# Patient Record
Sex: Female | Born: 1965 | Race: Black or African American | Hispanic: No | Marital: Married | Smoking: Never smoker
Health system: Southern US, Community
[De-identification: ages and names within clinical notes are randomized; demographics above are authoritative.]

## PROBLEM LIST (undated history)

## (undated) DIAGNOSIS — E669 Obesity, unspecified: Secondary | ICD-10-CM

## (undated) DIAGNOSIS — C8590 Non-Hodgkin lymphoma, unspecified, unspecified site: Secondary | ICD-10-CM

## (undated) HISTORY — PX: PORTACATH PLACEMENT: SHX2246

## (undated) HISTORY — DX: Obesity, unspecified: E66.9

## (undated) HISTORY — DX: Non-Hodgkin lymphoma, unspecified, unspecified site: C85.90

## (undated) HISTORY — PX: LAPAROSCOPIC GASTRIC SLEEVE RESECTION: SHX5895

---

## 2000-11-25 ENCOUNTER — Encounter: Admission: RE | Admit: 2000-11-25 | Discharge: 2000-11-25 | Payer: Self-pay

## 2002-02-02 ENCOUNTER — Encounter: Admission: RE | Admit: 2002-02-02 | Discharge: 2002-02-02 | Payer: Self-pay | Admitting: *Deleted

## 2002-02-02 ENCOUNTER — Encounter: Payer: Self-pay | Admitting: *Deleted

## 2002-05-17 ENCOUNTER — Other Ambulatory Visit: Admission: RE | Admit: 2002-05-17 | Discharge: 2002-05-17 | Payer: Self-pay | Admitting: Family Medicine

## 2005-02-03 ENCOUNTER — Encounter: Admission: RE | Admit: 2005-02-03 | Discharge: 2005-02-04 | Payer: Self-pay | Admitting: Family Medicine

## 2007-12-08 ENCOUNTER — Encounter: Admission: RE | Admit: 2007-12-08 | Discharge: 2007-12-08 | Payer: Self-pay | Admitting: Family Medicine

## 2010-04-18 ENCOUNTER — Inpatient Hospital Stay (HOSPITAL_COMMUNITY)
Admission: EM | Admit: 2010-04-18 | Discharge: 2010-04-21 | Payer: Self-pay | Source: Home / Self Care | Admitting: Emergency Medicine

## 2010-04-19 ENCOUNTER — Ambulatory Visit: Payer: Self-pay | Admitting: Surgery

## 2010-04-20 ENCOUNTER — Encounter (INDEPENDENT_AMBULATORY_CARE_PROVIDER_SITE_OTHER): Payer: Self-pay | Admitting: Internal Medicine

## 2010-04-22 ENCOUNTER — Ambulatory Visit: Payer: Self-pay | Admitting: Internal Medicine

## 2010-04-23 ENCOUNTER — Ambulatory Visit: Payer: Self-pay | Admitting: Surgery

## 2010-04-23 ENCOUNTER — Ambulatory Visit: Payer: Self-pay | Admitting: Internal Medicine

## 2010-04-28 ENCOUNTER — Encounter: Payer: Self-pay | Admitting: Internal Medicine

## 2010-04-28 ENCOUNTER — Ambulatory Visit (HOSPITAL_COMMUNITY): Admission: RE | Admit: 2010-04-28 | Discharge: 2010-04-28 | Payer: Self-pay | Admitting: Internal Medicine

## 2010-04-28 ENCOUNTER — Ambulatory Visit: Payer: Self-pay | Admitting: Cardiology

## 2010-04-28 ENCOUNTER — Ambulatory Visit: Payer: Self-pay

## 2010-04-29 ENCOUNTER — Ambulatory Visit (HOSPITAL_COMMUNITY): Admission: RE | Admit: 2010-04-29 | Discharge: 2010-04-29 | Payer: Self-pay | Admitting: Internal Medicine

## 2010-04-30 LAB — CBC WITH DIFFERENTIAL/PLATELET
BASO%: 0.4 % (ref 0.0–2.0)
Basophils Absolute: 0 10*3/uL (ref 0.0–0.1)
EOS%: 0.7 % (ref 0.0–7.0)
Eosinophils Absolute: 0 10*3/uL (ref 0.0–0.5)
HCT: 34 % — ABNORMAL LOW (ref 34.8–46.6)
HGB: 11.7 g/dL (ref 11.6–15.9)
LYMPH%: 27.7 % (ref 14.0–49.7)
MCH: 29.4 pg (ref 25.1–34.0)
MCHC: 34.5 g/dL (ref 31.5–36.0)
MCV: 85.1 fL (ref 79.5–101.0)
MONO#: 0.6 10*3/uL (ref 0.1–0.9)
MONO%: 10 % (ref 0.0–14.0)
NEUT#: 3.6 10*3/uL (ref 1.5–6.5)
NEUT%: 61.2 % (ref 38.4–76.8)
Platelets: 265 10*3/uL (ref 145–400)
RBC: 3.99 10*6/uL (ref 3.70–5.45)
RDW: 13.6 % (ref 11.2–14.5)
WBC: 6 10*3/uL (ref 3.9–10.3)
lymph#: 1.6 10*3/uL (ref 0.9–3.3)

## 2010-05-01 LAB — COMPREHENSIVE METABOLIC PANEL
ALT: 8 U/L (ref 0–35)
AST: 14 U/L (ref 0–37)
Albumin: 3.8 g/dL (ref 3.5–5.2)
Alkaline Phosphatase: 81 U/L (ref 39–117)
BUN: 7 mg/dL (ref 6–23)
CO2: 28 mEq/L (ref 19–32)
Calcium: 9.1 mg/dL (ref 8.4–10.5)
Chloride: 99 mEq/L (ref 96–112)
Creatinine, Ser: 0.84 mg/dL (ref 0.40–1.20)
Glucose, Bld: 86 mg/dL (ref 70–99)
Potassium: 4.2 mEq/L (ref 3.5–5.3)
Sodium: 136 mEq/L (ref 135–145)
Total Bilirubin: 0.5 mg/dL (ref 0.3–1.2)
Total Protein: 7.3 g/dL (ref 6.0–8.3)

## 2010-05-01 LAB — LACTATE DEHYDROGENASE: LDH: 294 U/L — ABNORMAL HIGH (ref 94–250)

## 2010-05-01 LAB — HEPATITIS PANEL, ACUTE
HCV Ab: NEGATIVE
Hep A IgM: NEGATIVE
Hep B C IgM: NEGATIVE
Hepatitis B Surface Ag: NEGATIVE

## 2010-05-01 LAB — URIC ACID: Uric Acid, Serum: 5.9 mg/dL (ref 2.4–7.0)

## 2010-05-06 ENCOUNTER — Ambulatory Visit (HOSPITAL_COMMUNITY): Admission: RE | Admit: 2010-05-06 | Discharge: 2010-05-06 | Payer: Self-pay | Admitting: Surgery

## 2010-05-06 LAB — COMPREHENSIVE METABOLIC PANEL
ALT: 11 U/L (ref 0–35)
AST: 21 U/L (ref 0–37)
Albumin: 3.3 g/dL — ABNORMAL LOW (ref 3.5–5.2)
Alkaline Phosphatase: 69 U/L (ref 39–117)
BUN: 6 mg/dL (ref 6–23)
CO2: 30 mEq/L (ref 19–32)
Calcium: 9.1 mg/dL (ref 8.4–10.5)
Chloride: 101 mEq/L (ref 96–112)
Creatinine, Ser: 0.87 mg/dL (ref 0.40–1.20)
Glucose, Bld: 95 mg/dL (ref 70–99)
Potassium: 3.8 mEq/L (ref 3.5–5.3)
Sodium: 139 mEq/L (ref 135–145)
Total Bilirubin: 0.2 mg/dL — ABNORMAL LOW (ref 0.3–1.2)
Total Protein: 7.2 g/dL (ref 6.0–8.3)

## 2010-05-06 LAB — CBC WITH DIFFERENTIAL/PLATELET
BASO%: 0.6 % (ref 0.0–2.0)
Basophils Absolute: 0 10*3/uL (ref 0.0–0.1)
EOS%: 1.4 % (ref 0.0–7.0)
Eosinophils Absolute: 0.1 10*3/uL (ref 0.0–0.5)
HCT: 33.2 % — ABNORMAL LOW (ref 34.8–46.6)
HGB: 11.2 g/dL — ABNORMAL LOW (ref 11.6–15.9)
LYMPH%: 32.5 % (ref 14.0–49.7)
MCH: 29 pg (ref 25.1–34.0)
MCHC: 33.7 g/dL (ref 31.5–36.0)
MCV: 86 fL (ref 79.5–101.0)
MONO#: 0.5 10*3/uL (ref 0.1–0.9)
MONO%: 8.3 % (ref 0.0–14.0)
NEUT#: 3.2 10*3/uL (ref 1.5–6.5)
NEUT%: 57.2 % (ref 38.4–76.8)
Platelets: 308 10*3/uL (ref 145–400)
RBC: 3.86 10*6/uL (ref 3.70–5.45)
RDW: 13.6 % (ref 11.2–14.5)
WBC: 5.6 10*3/uL (ref 3.9–10.3)
lymph#: 1.8 10*3/uL (ref 0.9–3.3)

## 2010-05-06 LAB — LACTATE DEHYDROGENASE: LDH: 228 U/L (ref 94–250)

## 2010-05-14 LAB — CBC WITH DIFFERENTIAL/PLATELET
BASO%: 0.5 % (ref 0.0–2.0)
Basophils Absolute: 0 10*3/uL (ref 0.0–0.1)
EOS%: 1 % (ref 0.0–7.0)
Eosinophils Absolute: 0 10*3/uL (ref 0.0–0.5)
HCT: 34.4 % — ABNORMAL LOW (ref 34.8–46.6)
HGB: 11.7 g/dL (ref 11.6–15.9)
LYMPH%: 35.4 % (ref 14.0–49.7)
MCH: 29 pg (ref 25.1–34.0)
MCHC: 34 g/dL (ref 31.5–36.0)
MCV: 85.2 fL (ref 79.5–101.0)
MONO#: 0.1 10*3/uL (ref 0.1–0.9)
MONO%: 2.2 % (ref 0.0–14.0)
NEUT#: 2.6 10*3/uL (ref 1.5–6.5)
NEUT%: 60.9 % (ref 38.4–76.8)
Platelets: 178 10*3/uL (ref 145–400)
RBC: 4.04 10*6/uL (ref 3.70–5.45)
RDW: 13.7 % (ref 11.2–14.5)
WBC: 4.3 10*3/uL (ref 3.9–10.3)
lymph#: 1.5 10*3/uL (ref 0.9–3.3)

## 2010-05-14 LAB — COMPREHENSIVE METABOLIC PANEL
ALT: 9 U/L (ref 0–35)
AST: 12 U/L (ref 0–37)
Albumin: 3.9 g/dL (ref 3.5–5.2)
Alkaline Phosphatase: 91 U/L (ref 39–117)
BUN: 11 mg/dL (ref 6–23)
CO2: 26 mEq/L (ref 19–32)
Calcium: 9.3 mg/dL (ref 8.4–10.5)
Chloride: 100 mEq/L (ref 96–112)
Creatinine, Ser: 0.66 mg/dL (ref 0.40–1.20)
Glucose, Bld: 84 mg/dL (ref 70–99)
Potassium: 4.3 mEq/L (ref 3.5–5.3)
Sodium: 141 mEq/L (ref 135–145)
Total Bilirubin: 0.5 mg/dL (ref 0.3–1.2)
Total Protein: 6.9 g/dL (ref 6.0–8.3)

## 2010-05-14 LAB — LACTATE DEHYDROGENASE: LDH: 196 U/L (ref 94–250)

## 2010-05-14 LAB — URIC ACID: Uric Acid, Serum: 2.4 mg/dL (ref 2.4–7.0)

## 2010-05-21 LAB — COMPREHENSIVE METABOLIC PANEL
ALT: 12 U/L (ref 0–35)
AST: 19 U/L (ref 0–37)
Albumin: 4.6 g/dL (ref 3.5–5.2)
Alkaline Phosphatase: 102 U/L (ref 39–117)
BUN: 9 mg/dL (ref 6–23)
CO2: 25 mEq/L (ref 19–32)
Calcium: 9.4 mg/dL (ref 8.4–10.5)
Chloride: 102 mEq/L (ref 96–112)
Creatinine, Ser: 0.69 mg/dL (ref 0.40–1.20)
Glucose, Bld: 100 mg/dL — ABNORMAL HIGH (ref 70–99)
Potassium: 4 mEq/L (ref 3.5–5.3)
Sodium: 138 mEq/L (ref 135–145)
Total Bilirubin: 0.2 mg/dL — ABNORMAL LOW (ref 0.3–1.2)
Total Protein: 7.8 g/dL (ref 6.0–8.3)

## 2010-05-21 LAB — CBC WITH DIFFERENTIAL/PLATELET
BASO%: 0.4 % (ref 0.0–2.0)
Basophils Absolute: 0 10*3/uL (ref 0.0–0.1)
EOS%: 0.5 % (ref 0.0–7.0)
Eosinophils Absolute: 0 10*3/uL (ref 0.0–0.5)
HCT: 36.4 % (ref 34.8–46.6)
HGB: 12.1 g/dL (ref 11.6–15.9)
LYMPH%: 34.2 % (ref 14.0–49.7)
MCH: 28.5 pg (ref 25.1–34.0)
MCHC: 33.2 g/dL (ref 31.5–36.0)
MCV: 85.9 fL (ref 79.5–101.0)
MONO#: 0.6 10*3/uL (ref 0.1–0.9)
MONO%: 10.1 % (ref 0.0–14.0)
NEUT#: 3.2 10*3/uL (ref 1.5–6.5)
NEUT%: 54.8 % (ref 38.4–76.8)
Platelets: 223 10*3/uL (ref 145–400)
RBC: 4.24 10*6/uL (ref 3.70–5.45)
RDW: 13.9 % (ref 11.2–14.5)
WBC: 5.9 10*3/uL (ref 3.9–10.3)
lymph#: 2 10*3/uL (ref 0.9–3.3)

## 2010-05-27 ENCOUNTER — Ambulatory Visit: Payer: Self-pay | Admitting: Internal Medicine

## 2010-05-29 LAB — CBC WITH DIFFERENTIAL/PLATELET
Basophils Absolute: 0 10*3/uL (ref 0.0–0.1)
Eosinophils Absolute: 0 10*3/uL (ref 0.0–0.5)
HGB: 11.7 g/dL (ref 11.6–15.9)
LYMPH%: 32.1 % (ref 14.0–49.7)
MCV: 85 fL (ref 79.5–101.0)
MONO%: 12.3 % (ref 0.0–14.0)
NEUT#: 2.1 10*3/uL (ref 1.5–6.5)
NEUT%: 54.3 % (ref 38.4–76.8)
Platelets: 332 10*3/uL (ref 145–400)

## 2010-05-29 LAB — HCG, SERUM, QUALITATIVE: Preg, Serum: NEGATIVE

## 2010-05-29 LAB — COMPREHENSIVE METABOLIC PANEL
Albumin: 4 g/dL (ref 3.5–5.2)
Alkaline Phosphatase: 92 U/L (ref 39–117)
BUN: 10 mg/dL (ref 6–23)
Glucose, Bld: 95 mg/dL (ref 70–99)
Total Bilirubin: 0.3 mg/dL (ref 0.3–1.2)

## 2010-06-04 LAB — COMPREHENSIVE METABOLIC PANEL
AST: 11 U/L (ref 0–37)
Alkaline Phosphatase: 107 U/L (ref 39–117)
Glucose, Bld: 108 mg/dL — ABNORMAL HIGH (ref 70–99)
Potassium: 3.5 mEq/L (ref 3.5–5.3)
Sodium: 138 mEq/L (ref 135–145)
Total Bilirubin: 0.4 mg/dL (ref 0.3–1.2)
Total Protein: 7 g/dL (ref 6.0–8.3)

## 2010-06-04 LAB — CBC WITH DIFFERENTIAL/PLATELET
BASO%: 0.4 % (ref 0.0–2.0)
EOS%: 0.5 % (ref 0.0–7.0)
LYMPH%: 30.9 % (ref 14.0–49.7)
MCH: 29 pg (ref 25.1–34.0)
MCHC: 34 g/dL (ref 31.5–36.0)
MCV: 85.3 fL (ref 79.5–101.0)
MONO%: 1.9 % (ref 0.0–14.0)
Platelets: 135 10*3/uL — ABNORMAL LOW (ref 145–400)
RBC: 3.97 10*6/uL (ref 3.70–5.45)
RDW: 15.7 % — ABNORMAL HIGH (ref 11.2–14.5)

## 2010-06-12 LAB — CBC WITH DIFFERENTIAL/PLATELET
Basophils Absolute: 0 10*3/uL (ref 0.0–0.1)
EOS%: 0.4 % (ref 0.0–7.0)
Eosinophils Absolute: 0 10*3/uL (ref 0.0–0.5)
LYMPH%: 33.5 % (ref 14.0–49.7)
MCH: 29.6 pg (ref 25.1–34.0)
MCV: 85.7 fL (ref 79.5–101.0)
MONO%: 13.1 % (ref 0.0–14.0)
NEUT#: 2.3 10*3/uL (ref 1.5–6.5)
Platelets: 271 10*3/uL (ref 145–400)
RBC: 3.97 10*6/uL (ref 3.70–5.45)

## 2010-06-12 LAB — COMPREHENSIVE METABOLIC PANEL
Alkaline Phosphatase: 95 U/L (ref 39–117)
BUN: 14 mg/dL (ref 6–23)
Glucose, Bld: 104 mg/dL — ABNORMAL HIGH (ref 70–99)
Sodium: 138 mEq/L (ref 135–145)
Total Bilirubin: 0.3 mg/dL (ref 0.3–1.2)

## 2010-06-19 LAB — COMPREHENSIVE METABOLIC PANEL
ALT: 20 U/L (ref 0–35)
AST: 23 U/L (ref 0–37)
Albumin: 4.1 g/dL (ref 3.5–5.2)
Alkaline Phosphatase: 75 U/L (ref 39–117)
BUN: 12 mg/dL (ref 6–23)
CO2: 24 mEq/L (ref 19–32)
Calcium: 9 mg/dL (ref 8.4–10.5)
Chloride: 104 mEq/L (ref 96–112)
Creatinine, Ser: 0.67 mg/dL (ref 0.40–1.20)
Glucose, Bld: 94 mg/dL (ref 70–99)
Potassium: 4.1 mEq/L (ref 3.5–5.3)
Sodium: 139 mEq/L (ref 135–145)
Total Bilirubin: 0.3 mg/dL (ref 0.3–1.2)
Total Protein: 7.1 g/dL (ref 6.0–8.3)

## 2010-06-19 LAB — CBC WITH DIFFERENTIAL/PLATELET
BASO%: 0.8 % (ref 0.0–2.0)
Basophils Absolute: 0 10*3/uL (ref 0.0–0.1)
EOS%: 0.2 % (ref 0.0–7.0)
Eosinophils Absolute: 0 10*3/uL (ref 0.0–0.5)
HCT: 35 % (ref 34.8–46.6)
HGB: 11.6 g/dL (ref 11.6–15.9)
LYMPH%: 26.4 % (ref 14.0–49.7)
MCH: 28.4 pg (ref 25.1–34.0)
MCHC: 33.1 g/dL (ref 31.5–36.0)
MCV: 85.8 fL (ref 79.5–101.0)
MONO#: 0.4 10*3/uL (ref 0.1–0.9)
MONO%: 8.2 % (ref 0.0–14.0)
NEUT#: 3.1 10*3/uL (ref 1.5–6.5)
NEUT%: 64.4 % (ref 38.4–76.8)
Platelets: 372 10*3/uL (ref 145–400)
RBC: 4.08 10*6/uL (ref 3.70–5.45)
RDW: 16.8 % — ABNORMAL HIGH (ref 11.2–14.5)
WBC: 4.8 10*3/uL (ref 3.9–10.3)
lymph#: 1.3 10*3/uL (ref 0.9–3.3)
nRBC: 0 % (ref 0–0)

## 2010-06-19 LAB — LACTATE DEHYDROGENASE: LDH: 167 U/L (ref 94–250)

## 2010-06-25 LAB — COMPREHENSIVE METABOLIC PANEL
ALT: 10 U/L (ref 0–35)
AST: 13 U/L (ref 0–37)
Albumin: 3.9 g/dL (ref 3.5–5.2)
Alkaline Phosphatase: 103 U/L (ref 39–117)
BUN: 9 mg/dL (ref 6–23)
CO2: 27 mEq/L (ref 19–32)
Calcium: 8.9 mg/dL (ref 8.4–10.5)
Chloride: 102 mEq/L (ref 96–112)
Creatinine, Ser: 0.57 mg/dL (ref 0.40–1.20)
Glucose, Bld: 93 mg/dL (ref 70–99)
Potassium: 3.6 mEq/L (ref 3.5–5.3)
Sodium: 138 mEq/L (ref 135–145)
Total Bilirubin: 0.4 mg/dL (ref 0.3–1.2)
Total Protein: 6.8 g/dL (ref 6.0–8.3)

## 2010-06-25 LAB — CBC WITH DIFFERENTIAL/PLATELET
BASO%: 0.4 % (ref 0.0–2.0)
Basophils Absolute: 0 10*3/uL (ref 0.0–0.1)
EOS%: 0.3 % (ref 0.0–7.0)
Eosinophils Absolute: 0 10*3/uL (ref 0.0–0.5)
HCT: 33.2 % — ABNORMAL LOW (ref 34.8–46.6)
HGB: 11 g/dL — ABNORMAL LOW (ref 11.6–15.9)
LYMPH%: 24.7 % (ref 14.0–49.7)
MCH: 28.4 pg (ref 25.1–34.0)
MCHC: 33.1 g/dL (ref 31.5–36.0)
MCV: 85.8 fL (ref 79.5–101.0)
MONO#: 0.6 10*3/uL (ref 0.1–0.9)
MONO%: 7.6 % (ref 0.0–14.0)
NEUT#: 5.3 10*3/uL (ref 1.5–6.5)
NEUT%: 67 % (ref 38.4–76.8)
Platelets: 122 10*3/uL — ABNORMAL LOW (ref 145–400)
RBC: 3.87 10*6/uL (ref 3.70–5.45)
RDW: 17.1 % — ABNORMAL HIGH (ref 11.2–14.5)
WBC: 7.9 10*3/uL (ref 3.9–10.3)
lymph#: 1.9 10*3/uL (ref 0.9–3.3)
nRBC: 0 % (ref 0–0)

## 2010-06-30 ENCOUNTER — Ambulatory Visit: Payer: Self-pay | Admitting: Internal Medicine

## 2010-07-02 LAB — CBC WITH DIFFERENTIAL/PLATELET
BASO%: 0.5 % (ref 0.0–2.0)
Basophils Absolute: 0 10*3/uL (ref 0.0–0.1)
EOS%: 0.2 % (ref 0.0–7.0)
Eosinophils Absolute: 0 10*3/uL (ref 0.0–0.5)
HCT: 34.2 % — ABNORMAL LOW (ref 34.8–46.6)
HGB: 11.4 g/dL — ABNORMAL LOW (ref 11.6–15.9)
LYMPH%: 23.6 % (ref 14.0–49.7)
MCH: 29.2 pg (ref 25.1–34.0)
MCHC: 33.4 g/dL (ref 31.5–36.0)
MCV: 87.4 fL (ref 79.5–101.0)
MONO#: 0.5 10*3/uL (ref 0.1–0.9)
MONO%: 8.6 % (ref 0.0–14.0)
NEUT#: 4.2 10*3/uL (ref 1.5–6.5)
NEUT%: 67.1 % (ref 38.4–76.8)
Platelets: 245 10*3/uL (ref 145–400)
RBC: 3.91 10*6/uL (ref 3.70–5.45)
RDW: 19.1 % — ABNORMAL HIGH (ref 11.2–14.5)
WBC: 6.3 10*3/uL (ref 3.9–10.3)
lymph#: 1.5 10*3/uL (ref 0.9–3.3)

## 2010-07-02 LAB — COMPREHENSIVE METABOLIC PANEL
ALT: 11 U/L (ref 0–35)
AST: 19 U/L (ref 0–37)
Albumin: 4.1 g/dL (ref 3.5–5.2)
Alkaline Phosphatase: 92 U/L (ref 39–117)
BUN: 10 mg/dL (ref 6–23)
CO2: 27 mEq/L (ref 19–32)
Calcium: 9.5 mg/dL (ref 8.4–10.5)
Chloride: 101 mEq/L (ref 96–112)
Creatinine, Ser: 0.75 mg/dL (ref 0.40–1.20)
Glucose, Bld: 106 mg/dL — ABNORMAL HIGH (ref 70–99)
Potassium: 3.5 mEq/L (ref 3.5–5.3)
Sodium: 138 mEq/L (ref 135–145)
Total Bilirubin: 0.2 mg/dL — ABNORMAL LOW (ref 0.3–1.2)
Total Protein: 6.8 g/dL (ref 6.0–8.3)

## 2010-07-06 ENCOUNTER — Ambulatory Visit (HOSPITAL_COMMUNITY)
Admission: RE | Admit: 2010-07-06 | Discharge: 2010-07-06 | Payer: Self-pay | Source: Home / Self Care | Attending: Internal Medicine | Admitting: Internal Medicine

## 2010-07-09 LAB — LACTATE DEHYDROGENASE: LDH: 161 U/L (ref 94–250)

## 2010-07-09 LAB — COMPREHENSIVE METABOLIC PANEL
ALT: 17 U/L (ref 0–35)
AST: 25 U/L (ref 0–37)
Albumin: 4 g/dL (ref 3.5–5.2)
Alkaline Phosphatase: 77 U/L (ref 39–117)
BUN: 9 mg/dL (ref 6–23)
CO2: 22 mEq/L (ref 19–32)
Calcium: 9.2 mg/dL (ref 8.4–10.5)
Chloride: 107 mEq/L (ref 96–112)
Creatinine, Ser: 0.75 mg/dL (ref 0.40–1.20)
Glucose, Bld: 114 mg/dL — ABNORMAL HIGH (ref 70–99)
Potassium: 3.8 mEq/L (ref 3.5–5.3)
Sodium: 141 mEq/L (ref 135–145)
Total Bilirubin: 0.3 mg/dL (ref 0.3–1.2)
Total Protein: 7.2 g/dL (ref 6.0–8.3)

## 2010-07-09 LAB — CBC WITH DIFFERENTIAL/PLATELET
BASO%: 0.5 % (ref 0.0–2.0)
Basophils Absolute: 0 10*3/uL (ref 0.0–0.1)
EOS%: 0.3 % (ref 0.0–7.0)
Eosinophils Absolute: 0 10*3/uL (ref 0.0–0.5)
HCT: 33.8 % — ABNORMAL LOW (ref 34.8–46.6)
HGB: 11.4 g/dL — ABNORMAL LOW (ref 11.6–15.9)
LYMPH%: 27.6 % (ref 14.0–49.7)
MCH: 29.6 pg (ref 25.1–34.0)
MCHC: 33.8 g/dL (ref 31.5–36.0)
MCV: 87.7 fL (ref 79.5–101.0)
MONO#: 0.5 10*3/uL (ref 0.1–0.9)
MONO%: 11.9 % (ref 0.0–14.0)
NEUT#: 2.3 10*3/uL (ref 1.5–6.5)
NEUT%: 59.7 % (ref 38.4–76.8)
Platelets: 389 10*3/uL (ref 145–400)
RBC: 3.85 10*6/uL (ref 3.70–5.45)
RDW: 19.3 % — ABNORMAL HIGH (ref 11.2–14.5)
WBC: 3.8 10*3/uL — ABNORMAL LOW (ref 3.9–10.3)
lymph#: 1 10*3/uL (ref 0.9–3.3)

## 2010-07-16 LAB — COMPREHENSIVE METABOLIC PANEL
ALT: 9 U/L (ref 0–35)
AST: 12 U/L (ref 0–37)
Albumin: 3.6 g/dL (ref 3.5–5.2)
Alkaline Phosphatase: 86 U/L (ref 39–117)
BUN: 11 mg/dL (ref 6–23)
CO2: 23 mEq/L (ref 19–32)
Calcium: 8.5 mg/dL (ref 8.4–10.5)
Chloride: 102 mEq/L (ref 96–112)
Creatinine, Ser: 0.67 mg/dL (ref 0.40–1.20)
Glucose, Bld: 86 mg/dL (ref 70–99)
Potassium: 3.4 mEq/L — ABNORMAL LOW (ref 3.5–5.3)
Sodium: 137 mEq/L (ref 135–145)
Total Bilirubin: 0.5 mg/dL (ref 0.3–1.2)
Total Protein: 6.4 g/dL (ref 6.0–8.3)

## 2010-07-16 LAB — CBC WITH DIFFERENTIAL/PLATELET
BASO%: 0.1 % (ref 0.0–2.0)
Basophils Absolute: 0 10*3/uL (ref 0.0–0.1)
EOS%: 0.3 % (ref 0.0–7.0)
Eosinophils Absolute: 0 10*3/uL (ref 0.0–0.5)
HCT: 32.7 % — ABNORMAL LOW (ref 34.8–46.6)
HGB: 11.1 g/dL — ABNORMAL LOW (ref 11.6–15.9)
LYMPH%: 21.7 % (ref 14.0–49.7)
MCH: 30 pg (ref 25.1–34.0)
MCHC: 33.9 g/dL (ref 31.5–36.0)
MCV: 88.6 fL (ref 79.5–101.0)
MONO#: 0.1 10*3/uL (ref 0.1–0.9)
MONO%: 1.4 % (ref 0.0–14.0)
NEUT#: 4.4 10*3/uL (ref 1.5–6.5)
NEUT%: 76.5 % (ref 38.4–76.8)
Platelets: 144 10*3/uL — ABNORMAL LOW (ref 145–400)
RBC: 3.69 10*6/uL — ABNORMAL LOW (ref 3.70–5.45)
RDW: 19.8 % — ABNORMAL HIGH (ref 11.2–14.5)
WBC: 5.7 10*3/uL (ref 3.9–10.3)
lymph#: 1.2 10*3/uL (ref 0.9–3.3)

## 2010-07-23 LAB — CBC WITH DIFFERENTIAL/PLATELET
BASO%: 2.1 % — ABNORMAL HIGH (ref 0.0–2.0)
Basophils Absolute: 0.1 10*3/uL (ref 0.0–0.1)
EOS%: 0.2 % (ref 0.0–7.0)
Eosinophils Absolute: 0 10*3/uL (ref 0.0–0.5)
HCT: 35.2 % (ref 34.8–46.6)
HGB: 12 g/dL (ref 11.6–15.9)
LYMPH%: 23.5 % (ref 14.0–49.7)
MCH: 29.8 pg (ref 25.1–34.0)
MCHC: 34 g/dL (ref 31.5–36.0)
MCV: 87.7 fL (ref 79.5–101.0)
MONO#: 0.6 10*3/uL (ref 0.1–0.9)
MONO%: 9 % (ref 0.0–14.0)
NEUT#: 4.2 10*3/uL (ref 1.5–6.5)
NEUT%: 65.2 % (ref 38.4–76.8)
Platelets: 239 10*3/uL (ref 145–400)
RBC: 4.02 10*6/uL (ref 3.70–5.45)
RDW: 19.5 % — ABNORMAL HIGH (ref 11.2–14.5)
WBC: 6.5 10*3/uL (ref 3.9–10.3)
lymph#: 1.5 10*3/uL (ref 0.9–3.3)

## 2010-07-23 LAB — COMPREHENSIVE METABOLIC PANEL
ALT: 17 U/L (ref 0–35)
AST: 23 U/L (ref 0–37)
Albumin: 4 g/dL (ref 3.5–5.2)
Alkaline Phosphatase: 88 U/L (ref 39–117)
BUN: 8 mg/dL (ref 6–23)
CO2: 27 mEq/L (ref 19–32)
Calcium: 9.6 mg/dL (ref 8.4–10.5)
Chloride: 103 mEq/L (ref 96–112)
Creatinine, Ser: 0.72 mg/dL (ref 0.40–1.20)
Glucose, Bld: 88 mg/dL (ref 70–99)
Potassium: 4.1 mEq/L (ref 3.5–5.3)
Sodium: 140 mEq/L (ref 135–145)
Total Bilirubin: 0.4 mg/dL (ref 0.3–1.2)
Total Protein: 7.7 g/dL (ref 6.0–8.3)

## 2010-07-30 LAB — CBC WITH DIFFERENTIAL/PLATELET
BASO%: 0.5 % (ref 0.0–2.0)
Basophils Absolute: 0 10*3/uL (ref 0.0–0.1)
EOS%: 0.1 % (ref 0.0–7.0)
Eosinophils Absolute: 0 10*3/uL (ref 0.0–0.5)
HCT: 35.1 % (ref 34.8–46.6)
HGB: 11.9 g/dL (ref 11.6–15.9)
LYMPH%: 22.3 % (ref 14.0–49.7)
MCH: 30.1 pg (ref 25.1–34.0)
MCHC: 34 g/dL (ref 31.5–36.0)
MCV: 88.5 fL (ref 79.5–101.0)
MONO#: 0.5 10*3/uL (ref 0.1–0.9)
MONO%: 11.3 % (ref 0.0–14.0)
NEUT#: 3.1 10*3/uL (ref 1.5–6.5)
NEUT%: 65.8 % (ref 38.4–76.8)
Platelets: 382 10*3/uL (ref 145–400)
RBC: 3.97 10*6/uL (ref 3.70–5.45)
RDW: 19.8 % — ABNORMAL HIGH (ref 11.2–14.5)
WBC: 4.7 10*3/uL (ref 3.9–10.3)
lymph#: 1.1 10*3/uL (ref 0.9–3.3)

## 2010-07-30 LAB — COMPREHENSIVE METABOLIC PANEL
ALT: 11 U/L (ref 0–35)
AST: 20 U/L (ref 0–37)
Albumin: 4.3 g/dL (ref 3.5–5.2)
Alkaline Phosphatase: 76 U/L (ref 39–117)
BUN: 12 mg/dL (ref 6–23)
CO2: 22 mEq/L (ref 19–32)
Calcium: 9.5 mg/dL (ref 8.4–10.5)
Chloride: 103 mEq/L (ref 96–112)
Creatinine, Ser: 0.71 mg/dL (ref 0.40–1.20)
Glucose, Bld: 93 mg/dL (ref 70–99)
Potassium: 3.9 mEq/L (ref 3.5–5.3)
Sodium: 139 mEq/L (ref 135–145)
Total Bilirubin: 0.3 mg/dL (ref 0.3–1.2)
Total Protein: 7.3 g/dL (ref 6.0–8.3)

## 2010-07-30 LAB — LACTATE DEHYDROGENASE: LDH: 149 U/L (ref 94–250)

## 2010-08-04 ENCOUNTER — Ambulatory Visit: Payer: Self-pay | Admitting: Internal Medicine

## 2010-08-06 LAB — CBC WITH DIFFERENTIAL/PLATELET
BASO%: 0.5 % (ref 0.0–2.0)
Basophils Absolute: 0 10*3/uL (ref 0.0–0.1)
EOS%: 0.3 % (ref 0.0–7.0)
Eosinophils Absolute: 0 10*3/uL (ref 0.0–0.5)
HCT: 35 % (ref 34.8–46.6)
HGB: 12 g/dL (ref 11.6–15.9)
LYMPH%: 22 % (ref 14.0–49.7)
MCH: 30.5 pg (ref 25.1–34.0)
MCHC: 34.4 g/dL (ref 31.5–36.0)
MCV: 88.5 fL (ref 79.5–101.0)
MONO#: 0.6 10*3/uL (ref 0.1–0.9)
MONO%: 12.4 % (ref 0.0–14.0)
NEUT#: 3.2 10*3/uL (ref 1.5–6.5)
NEUT%: 64.8 % (ref 38.4–76.8)
Platelets: 303 10*3/uL (ref 145–400)
RBC: 3.95 10*6/uL (ref 3.70–5.45)
RDW: 19.3 % — ABNORMAL HIGH (ref 11.2–14.5)
WBC: 4.9 10*3/uL (ref 3.9–10.3)
lymph#: 1.1 10*3/uL (ref 0.9–3.3)

## 2010-08-06 LAB — COMPREHENSIVE METABOLIC PANEL
ALT: 19 U/L (ref 0–35)
AST: 25 U/L (ref 0–37)
Albumin: 4.2 g/dL (ref 3.5–5.2)
Alkaline Phosphatase: 78 U/L (ref 39–117)
BUN: 14 mg/dL (ref 6–23)
CO2: 24 mEq/L (ref 19–32)
Calcium: 9.4 mg/dL (ref 8.4–10.5)
Chloride: 103 mEq/L (ref 96–112)
Creatinine, Ser: 0.79 mg/dL (ref 0.40–1.20)
Glucose, Bld: 80 mg/dL (ref 70–99)
Potassium: 4.2 mEq/L (ref 3.5–5.3)
Sodium: 138 mEq/L (ref 135–145)
Total Bilirubin: 0.3 mg/dL (ref 0.3–1.2)
Total Protein: 7.2 g/dL (ref 6.0–8.3)

## 2010-08-06 LAB — LACTATE DEHYDROGENASE: LDH: 161 U/L (ref 94–250)

## 2010-08-08 ENCOUNTER — Other Ambulatory Visit: Payer: Self-pay | Admitting: Internal Medicine

## 2010-08-08 DIAGNOSIS — C8590 Non-Hodgkin lymphoma, unspecified, unspecified site: Secondary | ICD-10-CM

## 2010-08-19 ENCOUNTER — Ambulatory Visit: Payer: 59 | Attending: Radiation Oncology | Admitting: Radiation Oncology

## 2010-08-19 DIAGNOSIS — Z510 Encounter for antineoplastic radiation therapy: Secondary | ICD-10-CM | POA: Insufficient documentation

## 2010-08-19 DIAGNOSIS — C8582 Other specified types of non-Hodgkin lymphoma, intrathoracic lymph nodes: Secondary | ICD-10-CM | POA: Insufficient documentation

## 2010-09-07 ENCOUNTER — Encounter (HOSPITAL_BASED_OUTPATIENT_CLINIC_OR_DEPARTMENT_OTHER): Payer: 59 | Admitting: Internal Medicine

## 2010-09-07 DIAGNOSIS — C8582 Other specified types of non-Hodgkin lymphoma, intrathoracic lymph nodes: Secondary | ICD-10-CM

## 2010-09-07 DIAGNOSIS — Z452 Encounter for adjustment and management of vascular access device: Secondary | ICD-10-CM

## 2010-09-28 LAB — GLUCOSE, CAPILLARY: Glucose-Capillary: 100 mg/dL — ABNORMAL HIGH (ref 70–99)

## 2010-09-30 LAB — CBC
HCT: 35.4 % — ABNORMAL LOW (ref 36.0–46.0)
MCHC: 33.1 g/dL (ref 30.0–36.0)
MCV: 84.5 fL (ref 78.0–100.0)
RDW: 13.1 % (ref 11.5–15.5)

## 2010-09-30 LAB — COMPREHENSIVE METABOLIC PANEL
Alkaline Phosphatase: 73 U/L (ref 39–117)
BUN: 5 mg/dL — ABNORMAL LOW (ref 6–23)
CO2: 29 mEq/L (ref 19–32)
Calcium: 9.2 mg/dL (ref 8.4–10.5)
GFR calc non Af Amer: >60 mL/min (ref >60)
Glucose, Bld: 79 mg/dL (ref 70–99)
Total Protein: 7.3 g/dL (ref 6.0–8.3)

## 2010-09-30 LAB — PROTIME-INR
INR: 1.02 (ref 0.00–1.49)
Prothrombin Time: 13.6 seconds (ref 11.6–15.2)

## 2010-09-30 LAB — SURGICAL PCR SCREEN
MRSA, PCR: NEGATIVE
Staphylococcus aureus: NEGATIVE

## 2010-10-01 ENCOUNTER — Encounter: Payer: 59 | Admitting: Internal Medicine

## 2010-10-01 LAB — HEPATIC FUNCTION PANEL
ALT: 12 U/L (ref 0–35)
AST: 22 U/L (ref 0–37)
Bilirubin, Direct: 0.1 mg/dL (ref 0.0–0.3)
Indirect Bilirubin: 0.3 mg/dL (ref 0.3–0.9)
Total Protein: 7.6 g/dL (ref 6.0–8.3)

## 2010-10-01 LAB — CBC
Hemoglobin: 11.1 g/dL — ABNORMAL LOW (ref 12.0–15.0)
Hemoglobin: 12.2 g/dL (ref 12.0–15.0)
MCH: 26.5 pg (ref 26.0–34.0)
MCH: 29.4 pg (ref 26.0–34.0)
MCH: 29.5 pg (ref 26.0–34.0)
MCH: 29.8 pg (ref 26.0–34.0)
MCHC: 30.8 g/dL (ref 30.0–36.0)
MCHC: 33.9 g/dL (ref 30.0–36.0)
MCHC: 34.6 g/dL (ref 30.0–36.0)
MCV: 86.1 fL (ref 78.0–100.0)
MCV: 86.1 fL (ref 78.0–100.0)
Platelets: 232 10*3/uL (ref 150–400)
Platelets: 311 10*3/uL (ref 150–400)
RBC: 3.77 MIL/uL — ABNORMAL LOW (ref 3.87–5.11)
RDW: 14.1 % (ref 11.5–15.5)
WBC: 7.5 10*3/uL (ref 4.0–10.5)

## 2010-10-01 LAB — DIFFERENTIAL
Basophils Relative: 0 % (ref 0–1)
Eosinophils Absolute: 0 10*3/uL (ref 0.0–0.7)
Eosinophils Absolute: 0.1 10*3/uL (ref 0.0–0.7)
Eosinophils Relative: 1 % (ref 0–5)
Lymphs Abs: 1.8 10*3/uL (ref 0.7–4.0)
Lymphs Abs: 2 10*3/uL (ref 0.7–4.0)
Monocytes Relative: 4 % (ref 3–12)
Monocytes Relative: 6 % (ref 3–12)
Neutro Abs: 5.1 10*3/uL (ref 1.7–7.7)
Neutrophils Relative %: 68 % (ref 43–77)
Neutrophils Relative %: 69 % (ref 43–77)

## 2010-10-01 LAB — TROPONIN I: Troponin I: 0.01 ng/mL (ref 0.00–0.06)

## 2010-10-01 LAB — T4: T4, Total: 8.9 ug/dL (ref 5.0–12.5)

## 2010-10-01 LAB — THYROGLOBULIN LEVEL: Thyroglobulin: 119 ng/mL — ABNORMAL HIGH (ref 0.0–55.0)

## 2010-10-01 LAB — GLUCOSE, CAPILLARY: Glucose-Capillary: 103 mg/dL — ABNORMAL HIGH (ref 70–99)

## 2010-10-01 LAB — BASIC METABOLIC PANEL
Calcium: 8.6 mg/dL (ref 8.4–10.5)
Creatinine, Ser: 0.67 mg/dL (ref 0.4–1.2)
GFR calc Af Amer: >60 mL/min (ref >60)
GFR calc non Af Amer: >60 mL/min (ref >60)
Sodium: 137 mEq/L (ref 135–145)

## 2010-10-01 LAB — PROTIME-INR
INR: 0.95 (ref 0.00–1.49)
Prothrombin Time: 12.9 seconds (ref 11.6–15.2)

## 2010-10-01 LAB — HEPARIN LEVEL (UNFRACTIONATED)
Heparin Unfractionated: 0.28 IU/mL — ABNORMAL LOW (ref 0.30–0.70)
Heparin Unfractionated: 0.3 IU/mL (ref 0.30–0.70)
Heparin Unfractionated: 0.45 IU/mL (ref 0.30–0.70)

## 2010-10-01 LAB — T3, FREE: T3, Free: 2.8 pg/mL (ref 2.3–4.2)

## 2010-10-01 LAB — TSH
TSH: 2.07 u[IU]/mL (ref 0.350–4.500)
TSH: 2.47 u[IU]/mL (ref 0.350–4.500)

## 2010-10-01 LAB — APTT: aPTT: 43 seconds — ABNORMAL HIGH (ref 24–37)

## 2010-10-01 LAB — CALCIUM: Calcium: 8.9 mg/dL (ref 8.4–10.5)

## 2010-10-01 LAB — CK TOTAL AND CKMB (NOT AT ARMC): Relative Index: INVALID (ref 0.0–2.5)

## 2010-10-01 LAB — PHOSPHORUS: Phosphorus: 3.7 mg/dL (ref 2.3–4.6)

## 2010-10-01 LAB — LACTATE DEHYDROGENASE: LDH: 226 U/L (ref 94–250)

## 2010-11-05 ENCOUNTER — Ambulatory Visit: Payer: 59 | Attending: Radiation Oncology | Admitting: Radiation Oncology

## 2010-11-10 DIAGNOSIS — R05 Cough: Secondary | ICD-10-CM

## 2010-11-12 ENCOUNTER — Encounter: Payer: Self-pay | Admitting: Pulmonary Disease

## 2010-11-13 ENCOUNTER — Ambulatory Visit (INDEPENDENT_AMBULATORY_CARE_PROVIDER_SITE_OTHER): Payer: 59 | Admitting: Pulmonary Disease

## 2010-11-13 ENCOUNTER — Other Ambulatory Visit (INDEPENDENT_AMBULATORY_CARE_PROVIDER_SITE_OTHER): Payer: 59

## 2010-11-13 ENCOUNTER — Ambulatory Visit (INDEPENDENT_AMBULATORY_CARE_PROVIDER_SITE_OTHER)
Admission: RE | Admit: 2010-11-13 | Discharge: 2010-11-13 | Disposition: A | Discharge status: Home / Self Care | Payer: 59 | Source: Ambulatory Visit | Attending: Pulmonary Disease | Admitting: Pulmonary Disease

## 2010-11-13 ENCOUNTER — Encounter: Payer: Self-pay | Admitting: Pulmonary Disease

## 2010-11-13 VITALS — BP 130/80 | HR 127 | Temp 98.4°F | Ht 63.75 in | Wt 235.0 lb

## 2010-11-13 DIAGNOSIS — R05 Cough: Secondary | ICD-10-CM

## 2010-11-13 DIAGNOSIS — R059 Cough, unspecified: Secondary | ICD-10-CM

## 2010-11-13 LAB — CBC WITH DIFFERENTIAL/PLATELET
Basophils Absolute: 0 10*3/uL (ref 0.0–0.1)
Eosinophils Absolute: 0.2 10*3/uL (ref 0.0–0.7)
HCT: 39.7 % (ref 36.0–46.0)
Hemoglobin: 13.6 g/dL (ref 12.0–15.0)
Lymphocytes Relative: 7.9 % — ABNORMAL LOW (ref 12.0–46.0)
Lymphs Abs: 0.6 10*3/uL — ABNORMAL LOW (ref 0.7–4.0)
MCHC: 34.1 g/dL (ref 30.0–36.0)
Monocytes Relative: 10.9 % (ref 3.0–12.0)
Neutro Abs: 6.2 10*3/uL (ref 1.4–7.7)
Platelets: 274 10*3/uL (ref 150.0–400.0)
RDW: 15.3 % — ABNORMAL HIGH (ref 11.5–14.6)

## 2010-11-13 LAB — COMPREHENSIVE METABOLIC PANEL
ALT: 17 U/L (ref 0–35)
AST: 21 U/L (ref 0–37)
CO2: 30 mEq/L (ref 19–32)
Creatinine, Ser: 0.8 mg/dL (ref 0.4–1.2)
GFR: 104.16 mL/min (ref >60.00)
Sodium: 138 mEq/L (ref 135–145)
Total Bilirubin: 0.5 mg/dL (ref 0.3–1.2)
Total Protein: 7.2 g/dL (ref 6.0–8.3)

## 2010-11-13 NOTE — Assessment & Plan Note (Signed)
This is very likely related to upper airway or from esophagitis/ GERD No evidence of radiation pneumonitis on CXR. As such, I do not think more prednisone is needed. Her cough is 80% improved & hopefully wil continue to improve I have asked her to stay on PPI for another month at least. She can take cough suppressant such as delsym & if worse, we will give her a codeine syrup. I do not think any other intervention is required at this time - her labs look ok. I believe Dr Shirline Frees has planned repeat imaging in a  few months

## 2010-11-13 NOTE — Patient Instructions (Addendum)
Chest xray appears clear OK to stop prednisone Blood work -CBC, CMET Delsym OTC upto 3 times  Daily If no effect, call us for codeine syrup

## 2010-11-13 NOTE — Progress Notes (Signed)
  Subjective:    Patient ID: Terri Luna, female    DOB: March 20, 1966, 45 y.o.   MRN: [id number]  HPI Ref : Dr Michell Heinrich Onc: Mohammed  45/F, never smoker for evaluation of cough post radiation therapy. 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 CMET ok   Review of Systems  Constitutional: Positive for fever and unexpected weight change.  HENT: Positive for congestion, rhinorrhea and postnasal drip. Negative for ear pain, nosebleeds, sore throat, sneezing, trouble swallowing, dental problem and sinus pressure.   Eyes: Negative for redness and itching.  Respiratory: Positive for cough, shortness of breath and wheezing. Negative for chest tightness.   Cardiovascular: Negative for palpitations and leg swelling.  Gastrointestinal: Negative for nausea and vomiting.  Genitourinary: Negative for dysuria.  Musculoskeletal: Negative for joint swelling.  Skin: Negative for rash.  Neurological: Positive for headaches.  Hematological: Bruises/bleeds easily.  Psychiatric/Behavioral: Negative for dysphoric mood. The patient is not nervous/anxious.        Objective:   Physical Exam Gen. Pleasant, well-nourished, in no distress, normal affect ENT - no lesions, no post nasal drip Neck: No JVD, no thyromegaly, no carotid bruits Lungs: no use of accessory muscles, no dullness to percussion, clear without rales or rhonchi  Cardiovascular: Rhythm regular, heart sounds   normal, no murmurs or gallops, no peripheral edema Abdomen: soft and non-tender, no hepatosplenomegaly, BS normal. Musculoskeletal: No deformities, no cyanosis or clubbing Neuro:  alert, non focal         Assessment & Plan:

## 2010-11-16 NOTE — Progress Notes (Signed)
Quick Note:  Pt informed of lab results. Pt verbalized understanding. ______

## 2010-11-27 ENCOUNTER — Institutional Professional Consult (permissible substitution): Payer: 59 | Admitting: Emergency Medicine

## 2010-12-01 NOTE — Assessment & Plan Note (Signed)
OFFICE VISIT   ROBENA, EWY  DOB:  November 10, 1965                                        April 23, 2010  CHART #:  [id number]   The patient returned to my office today for followup to discuss the  results of recent needle biopsy of an invasive mediastinal mass with  manubrial destruction.  The pathology shows a large cell B lymphoma.  I  discussed the results with her family.  I told them there is no role for  surgical resection of this and that she would be treated with  chemotherapy and radiation.  She has seen Dr. Arbutus Ped and Dr. Michell Heinrich  in [facility] today and we will plan to place a Port-A-Cath for  chemotherapy and treatment will start around May 04, 2010, by Dr.  Arbutus Ped.   Evelene Croon, M.D.  Electronically Signed   BB/MEDQ  D:  04/23/2010  T:  04/24/2010  Job:  [id number]

## 2010-12-07 ENCOUNTER — Institutional Professional Consult (permissible substitution): Payer: 59 | Admitting: Pulmonary Disease

## 2010-12-10 ENCOUNTER — Ambulatory Visit
Admission: RE | Admit: 2010-12-10 | Discharge: 2010-12-10 | Disposition: A | Discharge status: Home / Self Care | Payer: 59 | Source: Ambulatory Visit | Attending: Radiation Oncology | Admitting: Radiation Oncology

## 2011-01-28 ENCOUNTER — Encounter (HOSPITAL_COMMUNITY)
Admission: RE | Admit: 2011-01-28 | Discharge: 2011-01-28 | Disposition: A | Discharge status: Home / Self Care | Payer: 59 | Source: Ambulatory Visit | Attending: Internal Medicine | Admitting: Internal Medicine

## 2011-01-28 DIAGNOSIS — C8590 Non-Hodgkin lymphoma, unspecified, unspecified site: Secondary | ICD-10-CM

## 2011-01-28 DIAGNOSIS — I319 Disease of pericardium, unspecified: Secondary | ICD-10-CM | POA: Insufficient documentation

## 2011-01-28 DIAGNOSIS — Z923 Personal history of irradiation: Secondary | ICD-10-CM | POA: Insufficient documentation

## 2011-01-28 DIAGNOSIS — C8589 Other specified types of non-Hodgkin lymphoma, extranodal and solid organ sites: Secondary | ICD-10-CM | POA: Insufficient documentation

## 2011-01-28 DIAGNOSIS — Z9221 Personal history of antineoplastic chemotherapy: Secondary | ICD-10-CM | POA: Insufficient documentation

## 2011-01-28 MED ORDER — FLUDEOXYGLUCOSE F - 18 (FDG) INJECTION
17.6000 | Freq: Once | INTRAVENOUS | Status: AC | PRN
  Administered 2011-01-28: 17.6 via INTRAVENOUS

## 2011-02-03 ENCOUNTER — Other Ambulatory Visit: Payer: Self-pay | Admitting: Internal Medicine

## 2011-02-03 ENCOUNTER — Encounter (HOSPITAL_BASED_OUTPATIENT_CLINIC_OR_DEPARTMENT_OTHER): Payer: 59 | Admitting: Internal Medicine

## 2011-02-03 DIAGNOSIS — C8582 Other specified types of non-Hodgkin lymphoma, intrathoracic lymph nodes: Secondary | ICD-10-CM

## 2011-02-03 LAB — COMPREHENSIVE METABOLIC PANEL
Alkaline Phosphatase: 93 U/L (ref 39–117)
BUN: 14 mg/dL (ref 6–23)
Glucose, Bld: 87 mg/dL (ref 70–99)
Sodium: 138 mEq/L (ref 135–145)
Total Bilirubin: 0.4 mg/dL (ref 0.3–1.2)
Total Protein: 7.5 g/dL (ref 6.0–8.3)

## 2011-02-03 LAB — CBC WITH DIFFERENTIAL/PLATELET
Basophils Absolute: 0 10*3/uL (ref 0.0–0.1)
EOS%: 1.1 % (ref 0.0–7.0)
Eosinophils Absolute: 0 10*3/uL (ref 0.0–0.5)
HCT: 37.9 % (ref 34.8–46.6)
HGB: 12.9 g/dL (ref 11.6–15.9)
LYMPH%: 22.1 % (ref 14.0–49.7)
MCH: 29.8 pg (ref 25.1–34.0)
MCV: 87.5 fL (ref 79.5–101.0)
MONO%: 10 % (ref 0.0–14.0)
NEUT#: 2.8 10*3/uL (ref 1.5–6.5)
NEUT%: 66.4 % (ref 38.4–76.8)
Platelets: 259 10*3/uL (ref 145–400)

## 2011-02-10 ENCOUNTER — Encounter: Payer: 59 | Admitting: Internal Medicine

## 2011-05-05 ENCOUNTER — Other Ambulatory Visit: Payer: Self-pay | Admitting: Internal Medicine

## 2011-05-05 DIAGNOSIS — Z1231 Encounter for screening mammogram for malignant neoplasm of breast: Secondary | ICD-10-CM

## 2011-05-07 ENCOUNTER — Ambulatory Visit
Admission: RE | Admit: 2011-05-07 | Discharge: 2011-05-07 | Disposition: A | Discharge status: Home / Self Care | Payer: 59 | Source: Ambulatory Visit | Attending: Internal Medicine | Admitting: Internal Medicine

## 2011-05-07 DIAGNOSIS — Z1231 Encounter for screening mammogram for malignant neoplasm of breast: Secondary | ICD-10-CM

## 2011-05-29 ENCOUNTER — Encounter: Payer: Self-pay | Admitting: *Deleted

## 2011-06-01 ENCOUNTER — Other Ambulatory Visit: Payer: Self-pay | Admitting: Internal Medicine

## 2011-06-01 DIAGNOSIS — C8582 Other specified types of non-Hodgkin lymphoma, intrathoracic lymph nodes: Secondary | ICD-10-CM | POA: Insufficient documentation

## 2011-06-17 ENCOUNTER — Ambulatory Visit
Admission: RE | Admit: 2011-06-17 | Discharge: 2011-06-17 | Disposition: A | Discharge status: Home / Self Care | Payer: 59 | Source: Ambulatory Visit | Attending: Radiation Oncology | Admitting: Radiation Oncology

## 2011-06-17 DIAGNOSIS — C8582 Other specified types of non-Hodgkin lymphoma, intrathoracic lymph nodes: Secondary | ICD-10-CM

## 2011-06-17 NOTE — Progress Notes (Signed)
Completed radiation to mediastinum 10/01/10 for NHL. PET completed 01/28/11  Mammogram done 10/12 negative. No problems voiced except occasional pain over mediastinum.

## 2011-06-17 NOTE — Progress Notes (Signed)
CC:   Lajuana Matte, M.D. Oretha Milch, MD Hal Morales, M.D.  DIAGNOSIS:  Non-Hodgkin's lymphoma.  PREVIOUS RADIATION:  40 Gy to the mediastinum, completed 10/01/2010.  INTERVAL SINCE TREATMENT:  Nine months.  INTERVAL HISTORY:  Terri Luna reports for followup today.  Her breathing symptoms have completely resolved.  She has no cough, shortness of breath or drainage.  She has occasional chest pain after she does a lot of movement, which is associative the middle of her chest and which she feels is musculoskeletal in nature.  It does not happen all the time and is unpredictable.  She has been having some problems accessing her port recently, and they were not able to flush it the last time she was in. She is looking forward to having that removed after she sees Dr. Arbutus Ped in January.  She did have a PET scan in July that was negative for recurrent disease.  She had bilateral breast mammograms which were negative.  Her main concern is of weight gain, and she has been referred to [facility] by Dr. Pennie Rushing but really does not think this is a good fit for her.  She feels as though she is probably spending more time inside just because of all the weight she has gained.  PHYSICAL EXAMINATION:  Vital Signs:  Weight is 253 pounds which is up about 16 pounds from when we last saw.  Temperature 98.2, pulse 85, blood pressure 118/83.  Lymphatics:  She has no palpable cervical or supraclavicular adenopathy.  General:  She is alert and oriented x3.  IMPRESSION:  Stage II non-Hodgkin's lymphoma with resolving acute effects of treatment.  RECOMMENDATIONS:  Chip Boer looks great.  I am glad the respiratory symptoms have resolved.  We talked about her weight loss.  She is going to investigate the [facility] Program at the [facility] to see if that may help. I recommended she undergo annual MRI and mammogram.  I will talk to Dr. Pennie Rushing about these recommendations.  I have released her from  followup with me as she does have regularly scheduled followup with Dr. Arbutus Ped. We will be happy to see her back at any point in the future.    ______________________________ Lurline Hare, M.D. SW/MEDQ  D:  06/17/2011  T:  06/17/2011  Job:  [id number]

## 2011-07-02 ENCOUNTER — Telehealth: Payer: Self-pay | Admitting: Internal Medicine

## 2011-07-02 NOTE — Telephone Encounter (Signed)
Pt called and asked if she can put a "relaxer in her hair". She has been off chemo x 1 year. I told her I did not know of any reason she cannot put a relaxer in her hair.

## 2011-07-14 ENCOUNTER — Other Ambulatory Visit: Payer: Self-pay | Admitting: Internal Medicine

## 2011-07-14 ENCOUNTER — Ambulatory Visit: Payer: 59

## 2011-07-14 DIAGNOSIS — C8590 Non-Hodgkin lymphoma, unspecified, unspecified site: Secondary | ICD-10-CM

## 2011-07-14 MED ORDER — SODIUM CHLORIDE 0.9 % IJ SOLN
10.0000 mL | INTRAMUSCULAR | Status: AC | PRN
  Filled 2011-07-14: qty 10

## 2011-07-14 MED ORDER — HEPARIN SOD (PORK) LOCK FLUSH 100 UNIT/ML IV SOLN
500.0000 [IU] | Freq: Once | INTRAVENOUS | Status: AC | PRN
  Filled 2011-07-14: qty 5

## 2011-07-22 ENCOUNTER — Telehealth: Payer: Self-pay | Admitting: Internal Medicine

## 2011-07-22 NOTE — Telephone Encounter (Signed)
lmonvm adviising the pt of her pet scan appt with instructions along with the md appt.

## 2011-08-02 ENCOUNTER — Encounter (HOSPITAL_COMMUNITY)
Admission: RE | Admit: 2011-08-02 | Discharge: 2011-08-02 | Disposition: A | Discharge status: Home / Self Care | Payer: 59 | Source: Ambulatory Visit | Attending: Internal Medicine | Admitting: Internal Medicine

## 2011-08-02 DIAGNOSIS — C8590 Non-Hodgkin lymphoma, unspecified, unspecified site: Secondary | ICD-10-CM

## 2011-08-02 DIAGNOSIS — C8589 Other specified types of non-Hodgkin lymphoma, extranodal and solid organ sites: Secondary | ICD-10-CM | POA: Insufficient documentation

## 2011-08-02 MED ORDER — FLUDEOXYGLUCOSE F - 18 (FDG) INJECTION
15.0000 | Freq: Once | INTRAVENOUS | Status: AC | PRN
  Administered 2011-08-02: 15 via INTRAVENOUS

## 2011-08-05 ENCOUNTER — Ambulatory Visit (HOSPITAL_BASED_OUTPATIENT_CLINIC_OR_DEPARTMENT_OTHER): Payer: 59 | Admitting: Internal Medicine

## 2011-08-05 ENCOUNTER — Telehealth: Payer: Self-pay | Admitting: Internal Medicine

## 2011-08-05 VITALS — BP 111/78 | HR 83 | Temp 97.0°F | Ht 62.5 in | Wt 251.9 lb

## 2011-08-05 DIAGNOSIS — Z87898 Personal history of other specified conditions: Secondary | ICD-10-CM

## 2011-08-05 DIAGNOSIS — Z923 Personal history of irradiation: Secondary | ICD-10-CM

## 2011-08-05 DIAGNOSIS — Z9221 Personal history of antineoplastic chemotherapy: Secondary | ICD-10-CM

## 2011-08-05 DIAGNOSIS — C8582 Other specified types of non-Hodgkin lymphoma, intrathoracic lymph nodes: Secondary | ICD-10-CM

## 2011-08-05 NOTE — Telephone Encounter (Signed)
gv pt appt schedule for July including ct scan.

## 2011-08-08 NOTE — Progress Notes (Signed)
[facility] OFFICE PROGRESS NOTE  Altamese Arden-Arcade, MD, MD [address], [address] [place] [address]  PRINCIPAL DIAGNOSIS:  Stage II bulky large B-cell non-Hodgkin lymphoma diagnosed in October 2011.  PRIOR THERAPY:   1. Status post 4 cycles of systemic chemotherapy with CHOP/Rituxan, last dose was given July 10, 2010. 2. Status post curative radiotherapy to the mediastinum under the care of Dr. Michell Heinrich completed on October 01, 2010.  CURRENT THERAPY:  Observation.  INTERVAL HISTORY: Terri Luna 46 y.o. female returns to the clinic today for six-month followup visit. The patient has no complaints today she is feeling fine except for back pain from sitting long time at her desk at work. The patient denied having any significant weight loss or night sweats. She has no palpable lymphadenopathy. Chest pain or shortness of breath, no cough or hemoptysis. Has repeat PET scan performed recently and she is here today for evaluation and discussion of her scan results.  MEDICAL HISTORY: Past Medical History  Diagnosis Date  . Lymphoma   . Obesity     ALLERGIES:   has no known allergies.  MEDICATIONS:  Current Outpatient Prescriptions  Medication Sig Dispense Refill  . acetaminophen (TYLENOL) 500 MG tablet Take 1,000 mg by mouth every 6 (six) hours as needed.        . clindamycin (CLEOCIN) 150 MG capsule Take 150 mg by mouth 4 (four) times daily.       . VENTOLIN HFA 108 (90 BASE) MCG/ACT inhaler 2 puffs every 4 (four) hours as needed.        No current facility-administered medications for this visit.   Facility-Administered Medications Ordered in Other Visits  Medication Dose Route Frequency Provider Last Rate Last Dose  . sodium chloride 0.9 % injection 10 mL  10 mL Intravenous PRN Javonta Gronau K. Arbutus Ped, MD        REVIEW OF SYSTEMS:  A comprehensive review of systems was negative.   PHYSICAL EXAMINATION: General appearance: alert, cooperative and no  distress Lymph nodes: Cervical, supraclavicular, and axillary nodes normal. Resp: clear to auscultation bilaterally Cardio: regular rate and rhythm, S1, S2 normal, no murmur, click, rub or gallop GI: soft, non-tender; bowel sounds normal; no masses,  no organomegaly Extremities: extremities normal, atraumatic, no cyanosis or edema  ECOG PERFORMANCE STATUS: 0 - Asymptomatic  Blood pressure 111/78, pulse 83, temperature 97 F (36.1 C), temperature source Oral, height 5' 2.5" (1.588 m), weight 251 lb 14.4 oz (114.261 kg).  LABORATORY DATA: Lab Results  Component Value Date   WBC 4.2 02/03/2011   HGB 12.9 02/03/2011   HCT 37.9 02/03/2011   MCV 87.5 02/03/2011   PLT 259 02/03/2011      Chemistry      Component Value Date/Time   NA 138 02/03/2011 1308   NA 138 02/03/2011 1308   K 4.1 02/03/2011 1308   K 4.1 02/03/2011 1308   CL 101 02/03/2011 1308   CL 101 02/03/2011 1308   CO2 26 02/03/2011 1308   CO2 26 02/03/2011 1308   BUN 14 02/03/2011 1308   BUN 14 02/03/2011 1308   CREATININE 0.79 02/03/2011 1308   CREATININE 0.79 02/03/2011 1308      Component Value Date/Time   CALCIUM 9.6 02/03/2011 1308   CALCIUM 9.6 02/03/2011 1308   ALKPHOS 93 02/03/2011 1308   ALKPHOS 93 02/03/2011 1308   AST 20 02/03/2011 1308   AST 20 02/03/2011 1308   ALT 15 02/03/2011 1308   ALT 15  02/03/2011 1308   BILITOT 0.4 02/03/2011 1308   BILITOT 0.4 02/03/2011 1308       RADIOGRAPHIC STUDIES: Nm Pet Image Restag (ps) Skull Base To Thigh  08/02/2011  *RADIOLOGY REPORT*  Clinical Data: Subsequent treatment strategy for non Hodgkin's lymphoma.  NUCLEAR MEDICINE PET SKULL BASE TO THIGH  Fasting Blood Glucose:  98  Technique:  15.0 mCi F-18 FDG was injected intravenously.   CT data was obtained and used for attenuation correction and anatomic localization only.  (This was not acquired as a diagnostic CT examination.) Additional exam technical data entered  on technologist worksheet.  Comparison: PET CT 01/28/2011  Findings:   Neck: No hypermetabolic nodes in the neck.  Chest: No hypermetabolic mediastinal or hilar nodes.  No suspicious pulmonary nodules.  Abdomen / Pelvis:No abnormal hypermetabolic activity within the solid organs.  No evidence of abdominal or pelvic hypermetabolic nodes.  Skeleton:No focal hypermetabolic activity to suggest skeletal metastasis.  IMPRESSION: No evidence of lung cancer recurrence.  Original Report Authenticated By: Genevive Bi, M.D.    ASSESSMENT: This is a very pleasant 46 years old African American female with history of stage II bulky large B-cell non-Hodgkin lymphoma status post 4 cycles of systemic chemotherapy with CHOP/Rituxan followed by curative radiotherapy. The patient is doing fine and she has no evidence for disease recurrence. I discussed the PET scan results with the patient.  PLAN: I recommended for her continuous observation for now with repeat CT scan of the chest, abdomen and pelvis in 6 months. The patient is interested in taking the Port-A-Cath out. I Will refer her to Dr. Laneta Simmers for removal of her Port-A-Cath. She was advised to call me if she has any concerning symptoms in the interval.  All questions were answered. The patient knows to call the clinic with any problems, questions or concerns. We can certainly see the patient much sooner if necessary.

## 2011-08-09 ENCOUNTER — Telehealth: Payer: Self-pay | Admitting: Internal Medicine

## 2011-08-09 ENCOUNTER — Telehealth: Payer: Self-pay | Admitting: *Deleted

## 2011-08-09 ENCOUNTER — Other Ambulatory Visit: Payer: Self-pay | Admitting: Internal Medicine

## 2011-08-09 DIAGNOSIS — C8590 Non-Hodgkin lymphoma, unspecified, unspecified site: Secondary | ICD-10-CM

## 2011-08-09 NOTE — Telephone Encounter (Signed)
Left a message to have pt call me about appt time and place

## 2011-08-09 NOTE — Telephone Encounter (Signed)
Returned pt's call and per pt she called dr bartel's office re having her port removed and office says she needs order/referral from MM to remove port. Message sent to nurse. Pt aware she will be contacted when MM has given order.

## 2011-08-10 ENCOUNTER — Telehealth: Payer: Self-pay | Admitting: Internal Medicine

## 2011-08-10 ENCOUNTER — Telehealth: Payer: Self-pay | Admitting: *Deleted

## 2011-08-10 NOTE — Telephone Encounter (Signed)
Spoke with pt regarding appt for port removal on Friday.  Pt stated that was ok.  Called [facility] office to have scheduled.

## 2011-08-10 NOTE — Telephone Encounter (Signed)
Talked to [facility] @ [facility], Dr Laneta Simmers will remove port on a Friday, pt has been called and waiting for a call back.

## 2011-08-11 ENCOUNTER — Encounter (HOSPITAL_COMMUNITY): Payer: Self-pay | Admitting: *Deleted

## 2011-08-11 ENCOUNTER — Encounter (HOSPITAL_COMMUNITY): Payer: Self-pay | Admitting: Pharmacy Technician

## 2011-08-11 ENCOUNTER — Other Ambulatory Visit: Payer: Self-pay

## 2011-08-11 DIAGNOSIS — C8582 Other specified types of non-Hodgkin lymphoma, intrathoracic lymph nodes: Secondary | ICD-10-CM

## 2011-08-11 MED ORDER — DEXTROSE 5 % IV SOLN
1.5000 g | INTRAVENOUS | Status: AC
  Administered 2011-08-12: 1.5 g via INTRAVENOUS
  Filled 2011-08-11: qty 1.5

## 2011-08-12 ENCOUNTER — Ambulatory Visit (HOSPITAL_COMMUNITY)
Admission: RE | Admit: 2011-08-12 | Discharge: 2011-08-12 | Disposition: A | Discharge status: Home / Self Care | Payer: 59 | Source: Ambulatory Visit | Attending: Surgery | Admitting: Surgery

## 2011-08-12 ENCOUNTER — Encounter (HOSPITAL_COMMUNITY): Payer: Self-pay | Admitting: Anesthesiology

## 2011-08-12 ENCOUNTER — Ambulatory Visit (HOSPITAL_COMMUNITY): Payer: 59 | Admitting: Anesthesiology

## 2011-08-12 ENCOUNTER — Encounter (HOSPITAL_COMMUNITY): Payer: Self-pay | Admitting: *Deleted

## 2011-08-12 ENCOUNTER — Encounter (HOSPITAL_COMMUNITY)
Admission: RE | Disposition: A | Discharge status: Home / Self Care | Payer: Self-pay | Source: Ambulatory Visit | Attending: Surgery

## 2011-08-12 ENCOUNTER — Ambulatory Visit (HOSPITAL_COMMUNITY): Payer: 59

## 2011-08-12 DIAGNOSIS — C8582 Other specified types of non-Hodgkin lymphoma, intrathoracic lymph nodes: Secondary | ICD-10-CM

## 2011-08-12 DIAGNOSIS — C8589 Other specified types of non-Hodgkin lymphoma, extranodal and solid organ sites: Secondary | ICD-10-CM | POA: Insufficient documentation

## 2011-08-12 DIAGNOSIS — Z452 Encounter for adjustment and management of vascular access device: Secondary | ICD-10-CM | POA: Insufficient documentation

## 2011-08-12 DIAGNOSIS — Z9221 Personal history of antineoplastic chemotherapy: Secondary | ICD-10-CM | POA: Insufficient documentation

## 2011-08-12 DIAGNOSIS — E669 Obesity, unspecified: Secondary | ICD-10-CM | POA: Insufficient documentation

## 2011-08-12 HISTORY — PX: PORT-A-CATH REMOVAL: SHX5289

## 2011-08-12 LAB — BASIC METABOLIC PANEL
BUN: 11 mg/dL (ref 6–23)
Calcium: 9.6 mg/dL (ref 8.4–10.5)
Creatinine, Ser: 0.78 mg/dL (ref 0.50–1.10)
GFR calc Af Amer: >90 mL/min (ref >90)
GFR calc non Af Amer: >90 mL/min (ref >90)
Glucose, Bld: 95 mg/dL (ref 70–99)

## 2011-08-12 LAB — CBC
HCT: 39 % (ref 36.0–46.0)
Hemoglobin: 13.4 g/dL (ref 12.0–15.0)
MCH: 30 pg (ref 26.0–34.0)
MCHC: 34.4 g/dL (ref 30.0–36.0)
MCV: 87.4 fL (ref 78.0–100.0)
RDW: 13.5 % (ref 11.5–15.5)

## 2011-08-12 SURGERY — REMOVAL PORT-A-CATH
Anesthesia: General | Site: Chest | Laterality: Right | Wound class: Clean

## 2011-08-12 MED ORDER — MUPIROCIN 2 % EX OINT
TOPICAL_OINTMENT | Freq: Two times a day (BID) | CUTANEOUS | Status: DC
  Administered 2011-08-12: 1 via NASAL

## 2011-08-12 MED ORDER — FENTANYL CITRATE 0.05 MG/ML IJ SOLN
INTRAMUSCULAR | Status: DC | PRN
  Administered 2011-08-12 (×2): 50 ug via INTRAVENOUS
  Administered 2011-08-12: 150 ug via INTRAVENOUS

## 2011-08-12 MED ORDER — HYDROMORPHONE HCL PF 1 MG/ML IJ SOLN: 0.2500 mg | INTRAMUSCULAR | Status: DC | PRN

## 2011-08-12 MED ORDER — OXYCODONE-ACETAMINOPHEN 5-325 MG PO TABS: 1.0000 | ORAL_TABLET | ORAL | Status: AC | PRN

## 2011-08-12 MED ORDER — LIDOCAINE-EPINEPHRINE (PF) 1 %-1:200000 IJ SOLN
INTRAMUSCULAR | Status: DC | PRN
  Administered 2011-08-12: 6 mL

## 2011-08-12 MED ORDER — 0.9 % SODIUM CHLORIDE (POUR BTL) OPTIME
TOPICAL | Status: DC | PRN
  Administered 2011-08-12: 1000 mL

## 2011-08-12 MED ORDER — MUPIROCIN 2 % EX OINT
TOPICAL_OINTMENT | CUTANEOUS | Status: AC
  Administered 2011-08-12: 1 via NASAL
  Filled 2011-08-12: qty 22

## 2011-08-12 MED ORDER — PROPOFOL 10 MG/ML IV BOLUS
INTRAVENOUS | Status: DC | PRN
  Administered 2011-08-12 (×2): 10 mg via INTRAVENOUS

## 2011-08-12 MED ORDER — ONDANSETRON HCL 4 MG/2ML IJ SOLN: 4.0000 mg | Freq: Once | INTRAMUSCULAR | Status: DC | PRN

## 2011-08-12 MED ORDER — LACTATED RINGERS IV SOLN
INTRAVENOUS | Status: DC | PRN
  Administered 2011-08-12: 10:00:00 via INTRAVENOUS

## 2011-08-12 MED ORDER — MIDAZOLAM HCL 5 MG/5ML IJ SOLN
INTRAMUSCULAR | Status: DC | PRN
  Administered 2011-08-12: 2 mg via INTRAVENOUS

## 2011-08-12 MED ORDER — LACTATED RINGERS IV SOLN: INTRAVENOUS | Status: DC

## 2011-08-12 SURGICAL SUPPLY — 33 items
BAG DECANTER FOR FLEXI CONT (MISCELLANEOUS) IMPLANT
BLADE SURG 11 STRL SS (BLADE) ×2 IMPLANT
CANISTER SUCTION 2500CC (MISCELLANEOUS) ×2 IMPLANT
CLOTH BEACON ORANGE TIMEOUT ST (SAFETY) ×2 IMPLANT
COVER SURGICAL LIGHT HANDLE (MISCELLANEOUS) ×4 IMPLANT
DERMABOND ADHESIVE PROPEN (GAUZE/BANDAGES/DRESSINGS) ×1
DERMABOND ADVANCED .7 DNX6 (GAUZE/BANDAGES/DRESSINGS) ×1 IMPLANT
DRAPE INCISE IOBAN 66X45 STRL (DRAPES) ×2 IMPLANT
DRAPE LAPAROTOMY T 102X78X121 (DRAPES) ×2 IMPLANT
ELECT CAUTERY BLADE 6.4 (BLADE) ×2 IMPLANT
ELECT REM PT RETURN 9FT ADLT (ELECTROSURGICAL) ×2
ELECTRODE REM PT RTRN 9FT ADLT (ELECTROSURGICAL) ×1 IMPLANT
GLOVE BIO SURGEON STRL SZ7 (GLOVE) ×2 IMPLANT
GLOVE BIOGEL PI IND STRL 7.0 (GLOVE) ×1 IMPLANT
GLOVE BIOGEL PI INDICATOR 7.0 (GLOVE) ×1
GLOVE EUDERMIC 7 POWDERFREE (GLOVE) ×2 IMPLANT
GOWN PREVENTION PLUS XLARGE (GOWN DISPOSABLE) ×2 IMPLANT
GOWN STRL NON-REIN LRG LVL3 (GOWN DISPOSABLE) ×4 IMPLANT
KIT BASIN OR (CUSTOM PROCEDURE TRAY) ×2 IMPLANT
KIT ROOM TURNOVER OR (KITS) ×2 IMPLANT
NEEDLE 22X1 1/2 (OR ONLY) (NEEDLE) ×2 IMPLANT
NS IRRIG 1000ML POUR BTL (IV SOLUTION) ×2 IMPLANT
PACK GENERAL/GYN (CUSTOM PROCEDURE TRAY) ×2 IMPLANT
PAD ARMBOARD 7.5X6 YLW CONV (MISCELLANEOUS) ×4 IMPLANT
SPONGE GAUZE 4X4 12PLY (GAUZE/BANDAGES/DRESSINGS) ×2 IMPLANT
SUT SILK 2 0 SH (SUTURE) IMPLANT
SUT VIC AB 3-0 SH 27 (SUTURE) ×1
SUT VIC AB 3-0 SH 27X BRD (SUTURE) ×1 IMPLANT
SYR 20CC LL (SYRINGE) IMPLANT
SYR CONTROL 10ML LL (SYRINGE) ×2 IMPLANT
TOWEL OR 17X24 6PK STRL BLUE (TOWEL DISPOSABLE) ×2 IMPLANT
TOWEL OR 17X26 10 PK STRL BLUE (TOWEL DISPOSABLE) ×2 IMPLANT
WATER STERILE IRR 1000ML POUR (IV SOLUTION) ×2 IMPLANT

## 2011-08-12 NOTE — H&P (Signed)
Terri Luna is an 46 y.o. female.   Chief Complaint: Shelda Pal cath that is not needed anymore HPI: 46 yo woman who had lymphoma treated with chemotherapy via portacath. She has completed treatment and oncology does not feel that she requires portacath anymore.  Past Medical History  Diagnosis Date  . Lymphoma   . Obesity     Past Surgical History  Procedure Date  . Portacath placement     Family History  Problem Relation Age of Onset  . Hypertension Mother   . Diabetes Maternal Grandmother   . Lung cancer Father   . Anemia Mother    Social History:  reports that she has never smoked. She has never used smokeless tobacco. She reports that she drinks alcohol. She reports that she does not use illicit drugs.  Allergies: No Known Allergies  Medications Prior to Admission  Medication Dose Route Frequency Provider Last Rate Last Dose  . cefUROXime (ZINACEF) 1.5 g in dextrose 5 % 50 mL IVPB  1.5 g Intravenous To OR Alleen Borne, MD      . lactated ringers infusion   Intravenous Continuous Melonie Florida, MD      . mupirocin ointment (BACTROBAN) 2 %   Nasal BID Alleen Borne, MD   1 application at 08/12/11 0759  . sodium chloride 0.9 % injection 10 mL  10 mL Intravenous PRN Mohamed K. Arbutus Ped, MD       No current outpatient prescriptions on file as of 08/12/2011.    Results for orders placed during the hospital encounter of 08/12/11 (from the past 48 hour(s))  CBC     Status: Normal   Collection Time   08/12/11  7:53 AM      Component Value Range Comment   WBC 6.0  4.0 - 10.5 (K/uL)    RBC 4.46  3.87 - 5.11 (MIL/uL)    Hemoglobin 13.4  12.0 - 15.0 (g/dL)    HCT 16.1  09.6 - 04.5 (%)    MCV 87.4  78.0 - 100.0 (fL)    MCH 30.0  26.0 - 34.0 (pg)    MCHC 34.4  30.0 - 36.0 (g/dL)    RDW 40.9  81.1 - 91.4 (%)    Platelets 235  150 - 400 (K/uL)   BASIC METABOLIC PANEL     Status: Normal   Collection Time   08/12/11  7:53 AM      Component Value Range Comment   Sodium 138   135 - 145 (mEq/L)    Potassium 3.9  3.5 - 5.1 (mEq/L)    Chloride 104  96 - 112 (mEq/L)    CO2 25  19 - 32 (mEq/L)    Glucose, Bld 95  70 - 99 (mg/dL)    BUN 11  6 - 23 (mg/dL)    Creatinine, Ser 7.82  0.50 - 1.10 (mg/dL)    Calcium 9.6  8.4 - 10.5 (mg/dL)    GFR calc non Af Amer >90  >90 (mL/min)    GFR calc Af Amer >90  >90 (mL/min)   SURGICAL PCR SCREEN     Status: Normal   Collection Time   08/12/11  8:08 AM      Component Value Range Comment   MRSA, PCR NEGATIVE  NEGATIVE     Staphylococcus aureus NEGATIVE  NEGATIVE     Dg Chest 2 View Within Previous 72 Hours.  Films Obtained On Friday Are Acceptable For Monday And Tuesday Cases  08/12/2011  *  RADIOLOGY REPORT*  Clinical Data: Hodgkin lymphoma.  Remove right Port-A-Cath.  CHEST - 2 VIEW  Comparison: 11/13/2010.  Findings: Trachea is midline.  Heart size normal.  Right subclavian Port-A-Cath tip projects over the SVC.  Lungs are low in volume but clear.  No pleural fluid.  IMPRESSION: No acute findings.  Original Report Authenticated By: Reyes Ivan, M.D.    ROS  Negative  Blood pressure 107/78, pulse 92, temperature 98 F (36.7 C), temperature source Oral, resp. rate 16, height 5\' 3"  (1.6 m), SpO2 98.00%. Physical Exam  Cardiovascular: Normal rate, regular rhythm and normal heart sounds.   Respiratory: Breath sounds normal.   Portacath in subcutaneous pocket right anterior chest wall  Assessment/Plan Portacath no longer needed.  Will remove. Procedure, alternative, benefits, and risks discussed with patient and she agrees to procede.  BARTLE,BRYAN K 08/12/2011, 10:58 AM

## 2011-08-12 NOTE — Anesthesia Preprocedure Evaluation (Addendum)
Anesthesia Evaluation  Patient identified by MRN, date of birth, ID band Patient awake and Patient confused    Reviewed: Allergy & Precautions, H&P , NPO status , Patient's Chart, lab work & pertinent test results, reviewed documented beta blocker date and time   Airway Mallampati: II      Dental  (+) Teeth Intact   Pulmonary  clear to auscultation        Cardiovascular Regular Normal    Neuro/Psych    GI/Hepatic   Endo/Other    Renal/GU      Musculoskeletal   Abdominal   Peds  Hematology   Anesthesia Other Findings   Reproductive/Obstetrics                         Anesthesia Physical Anesthesia Plan  ASA: II  Anesthesia Plan: General   Post-op Pain Management:    Induction: Intravenous  Airway Management Planned: Nasal Cannula  Additional Equipment:   Intra-op Plan:   Post-operative Plan:   Informed Consent: I have reviewed the patients History and Physical, chart, labs and discussed the procedure including the risks, benefits and alternatives for the proposed anesthesia with the patient or authorized representative who has indicated his/her understanding and acceptance.     Plan Discussed with: CRNA and Surgeon  Anesthesia Plan Comments: (Plan MAC  Kipp Brood, MD)        Anesthesia Quick Evaluation

## 2011-08-12 NOTE — Brief Op Note (Signed)
08/12/2011  11:45 AM  PATIENT:  Terri Luna  46 y.o. female  PRE-OPERATIVE DIAGNOSIS:  Non-Hodgkin's lymphoma  POST-OPERATIVE DIAGNOSIS:  Non-Hodgkin's lymphoma  PROCEDURE:  Procedure(s): REMOVAL PORT-A-CATH  SURGEON:  Surgeon(s): Alleen Borne, MD  PHYSICIAN ASSISTANT: none   ANESTHESIA:   local and IV sedation  EBL:   none  BLOOD ADMINISTERED:none  To PACU, stable

## 2011-08-12 NOTE — Preoperative (Signed)
Beta Blockers   Reason not to administer Beta Blockers:Not Applicable 

## 2011-08-12 NOTE — Transfer of Care (Signed)
Immediate Anesthesia Transfer of Care Note  Patient: Terri Luna  Procedure(s) Performed:  REMOVAL PORT-A-CATH  Patient Location: PACU  Anesthesia Type: MAC  Level of Consciousness: awake, alert  and oriented  Airway & Oxygen Therapy: Patient Spontanous Breathing  Post-op Assessment: Report given to PACU RN  Post vital signs: Reviewed and stable  Complications: No apparent anesthesia complications

## 2011-08-12 NOTE — Anesthesia Postprocedure Evaluation (Signed)
  Anesthesia Post-op Note  Patient: Terri Luna  Procedure(s) Performed:  REMOVAL PORT-A-CATH  Patient Location: PACU  Anesthesia Type: MAC  Level of Consciousness: awake and alert   Airway and Oxygen Therapy: Patient Spontanous Breathing  Post-op Pain: none  Post-op Assessment: Post-op Vital signs reviewed  Post-op Vital Signs: stable  Complications: No apparent anesthesia complications

## 2011-08-12 NOTE — Interval H&P Note (Signed)
History and Physical Interval Note:  08/12/2011 11:02 AM  Terri Luna  has presented today for surgery, with the diagnosis of Non-Hodgkin's lymphoma  The various methods of treatment have been discussed with the patient and family. After consideration of risks, benefits and other options for treatment, the patient has consented to  Procedure(s): REMOVAL PORT-A-CATH as a surgical intervention .  The patients' history has been reviewed, patient examined, no change in status, stable for surgery.  I have reviewed the patients' chart and labs.  Questions were answered to the patient's satisfaction.     Alleen Borne

## 2011-08-13 ENCOUNTER — Encounter: Payer: Self-pay | Admitting: *Deleted

## 2011-08-16 NOTE — Telephone Encounter (Signed)
Erroneous message

## 2011-08-24 ENCOUNTER — Ambulatory Visit (INDEPENDENT_AMBULATORY_CARE_PROVIDER_SITE_OTHER): Payer: 59 | Admitting: *Deleted

## 2011-08-24 ENCOUNTER — Ambulatory Visit: Payer: 59 | Admitting: Surgery

## 2011-08-24 DIAGNOSIS — C3490 Malignant neoplasm of unspecified part of unspecified bronchus or lung: Secondary | ICD-10-CM

## 2011-08-24 DIAGNOSIS — Z8571 Personal history of Hodgkin lymphoma: Secondary | ICD-10-CM

## 2011-08-24 DIAGNOSIS — Z09 Encounter for follow-up examination after completed treatment for conditions other than malignant neoplasm: Secondary | ICD-10-CM

## 2011-08-24 NOTE — Progress Notes (Unsigned)
Mrs. Terri Luna had removal of her RIGHT PORT-A-CATH on 08/12/11 by Dr. Laneta Simmers.  She returns for inspection of the site.  It is completely healed without s/s of infection, only residual Dermabond remains.  She will rtc prn.

## 2011-08-25 ENCOUNTER — Ambulatory Visit: Payer: 59

## 2011-08-25 MED ORDER — HEPARIN SOD (PORK) LOCK FLUSH 100 UNIT/ML IV SOLN
500.0000 [IU] | Freq: Once | INTRAVENOUS | Status: AC | PRN
  Filled 2011-08-25: qty 5

## 2011-08-25 MED ORDER — SODIUM CHLORIDE 0.9 % IJ SOLN
10.0000 mL | INTRAMUSCULAR | Status: AC | PRN
  Filled 2011-08-25: qty 10

## 2012-02-02 ENCOUNTER — Ambulatory Visit (HOSPITAL_COMMUNITY)
Admission: RE | Admit: 2012-02-02 | Discharge: 2012-02-02 | Disposition: A | Discharge status: Home / Self Care | Payer: 59 | Source: Ambulatory Visit | Attending: Internal Medicine | Admitting: Internal Medicine

## 2012-02-02 ENCOUNTER — Other Ambulatory Visit (HOSPITAL_BASED_OUTPATIENT_CLINIC_OR_DEPARTMENT_OTHER): Payer: 59 | Admitting: Lab

## 2012-02-02 DIAGNOSIS — C8582 Other specified types of non-Hodgkin lymphoma, intrathoracic lymph nodes: Secondary | ICD-10-CM

## 2012-02-02 DIAGNOSIS — Z923 Personal history of irradiation: Secondary | ICD-10-CM | POA: Insufficient documentation

## 2012-02-02 DIAGNOSIS — K5730 Diverticulosis of large intestine without perforation or abscess without bleeding: Secondary | ICD-10-CM | POA: Insufficient documentation

## 2012-02-02 DIAGNOSIS — Z9221 Personal history of antineoplastic chemotherapy: Secondary | ICD-10-CM | POA: Insufficient documentation

## 2012-02-02 DIAGNOSIS — K449 Diaphragmatic hernia without obstruction or gangrene: Secondary | ICD-10-CM | POA: Insufficient documentation

## 2012-02-02 DIAGNOSIS — C8589 Other specified types of non-Hodgkin lymphoma, extranodal and solid organ sites: Secondary | ICD-10-CM | POA: Insufficient documentation

## 2012-02-02 LAB — CMP (CANCER CENTER ONLY)
ALT(SGPT): 25 U/L (ref 10–47)
AST: 28 U/L (ref 11–38)
Albumin: 3.6 g/dL (ref 3.3–5.5)
Alkaline Phosphatase: 96 U/L — ABNORMAL HIGH (ref 26–84)
Potassium: 4.4 mEq/L (ref 3.3–4.7)
Sodium: 140 mEq/L (ref 128–145)
Total Bilirubin: 0.6 mg/dl (ref 0.20–1.60)
Total Protein: 7.5 g/dL (ref 6.4–8.1)

## 2012-02-02 LAB — CBC WITH DIFFERENTIAL/PLATELET
BASO%: 0.6 % (ref 0.0–2.0)
EOS%: 2.6 % (ref 0.0–7.0)
Eosinophils Absolute: 0.1 10*3/uL (ref 0.0–0.5)
LYMPH%: 24.5 % (ref 14.0–49.7)
MCHC: 34.1 g/dL (ref 31.5–36.0)
MCV: 90.1 fL (ref 79.5–101.0)
MONO%: 7 % (ref 0.0–14.0)
NEUT#: 3.3 10*3/uL (ref 1.5–6.5)
Platelets: 251 10*3/uL (ref 145–400)
RBC: 4.36 10*6/uL (ref 3.70–5.45)
RDW: 14.4 % (ref 11.2–14.5)
WBC: 5.1 10*3/uL (ref 3.9–10.3)

## 2012-02-02 MED ORDER — IOHEXOL 300 MG/ML  SOLN
100.0000 mL | Freq: Once | INTRAMUSCULAR | Status: AC | PRN
  Administered 2012-02-02: 100 mL via INTRAVENOUS

## 2012-02-07 ENCOUNTER — Ambulatory Visit (HOSPITAL_BASED_OUTPATIENT_CLINIC_OR_DEPARTMENT_OTHER): Payer: 59 | Admitting: Internal Medicine

## 2012-02-07 ENCOUNTER — Telehealth: Payer: Self-pay | Admitting: *Deleted

## 2012-02-07 ENCOUNTER — Ambulatory Visit: Payer: 59 | Admitting: Internal Medicine

## 2012-02-07 VITALS — BP 126/86 | HR 101 | Temp 97.6°F | Ht 63.0 in | Wt 256.7 lb

## 2012-02-07 DIAGNOSIS — C8582 Other specified types of non-Hodgkin lymphoma, intrathoracic lymph nodes: Secondary | ICD-10-CM

## 2012-02-07 NOTE — Progress Notes (Signed)
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   PRIOR THERAPY:  1. Status post 4 cycles of systemic chemotherapy with CHOP/Rituxan, last dose was given July 10, 2010. 2. Status post curative radiotherapy to the mediastinum under the care of Dr. Michell Heinrich completed on October 01, 2010.  CURRENT THERAPY: Observation.  INTERVAL HISTORY: Terri Luna 46 y.o. female returns to the clinic today for six-month followup visit. The patient is feeling fine today with no specific complaints. She denied having any significant weight loss or night sweats. She has no chest pain or shortness of breath, no cough or hemoptysis. The patient denied having any palpable lymphadenopathy. She has repeat CT scan of the chest, abdomen and pelvis performed recently and she is here today for evaluation and discussion of her scan results.  MEDICAL HISTORY: Past Medical History  Diagnosis Date  . Lymphoma   . Obesity     ALLERGIES:   has no known allergies.  MEDICATIONS:  No current outpatient prescriptions on file.   No current facility-administered medications for this visit.   Facility-Administered Medications Ordered in Other Visits  Medication Dose Route Frequency Provider Last Rate Last Dose  . sodium chloride 0.9 % injection 10 mL  10 mL Intravenous PRN Si Gaul, MD      . sodium chloride 0.9 % injection 10 mL  10 mL Intravenous PRN Si Gaul, MD        SURGICAL HISTORY:  Past Surgical History  Procedure Date  . Portacath placement   . Port-a-cath removal 08/12/2011    Procedure: REMOVAL PORT-A-CATH;  Surgeon: Alleen Borne, MD;  Location: [facility] OR;  Service: Thoracic;  Laterality: Right;    REVIEW OF SYSTEMS:  A comprehensive review of systems was  negative.   PHYSICAL EXAMINATION: General appearance: alert, cooperative and no distress Head: Normocephalic, without obvious abnormality, atraumatic Neck: no adenopathy Lymph nodes: Cervical, supraclavicular, and axillary nodes normal. Resp: clear to auscultation bilaterally Cardio: regular rate and rhythm, S1, S2 normal, no murmur, click, rub or gallop GI: soft, non-tender; bowel sounds normal; no masses,  no organomegaly Extremities: extremities normal, atraumatic, no cyanosis or edema Neurologic: Alert and oriented X 3, normal strength and tone. Normal symmetric reflexes. Normal coordination and gait  ECOG PERFORMANCE STATUS: 0 - Asymptomatic  Blood pressure 126/86, pulse 101, temperature 97.6 F (36.4 C), temperature source Oral, height 5\' 3"  (1.6 m), weight 256 lb 11.2 oz (116.438 kg).  LABORATORY DATA: Lab Results  Component Value Date   WBC 5.1 02/02/2012   HGB 13.4 02/02/2012   HCT 39.3 02/02/2012   MCV 90.1 02/02/2012   PLT 251 02/02/2012      Chemistry      Component Value Date/Time   NA 140 02/02/2012 0919   NA 138 08/12/2011 0753   K 4.4 02/02/2012 0919   K 3.9 08/12/2011 0753   CL 100 02/02/2012 0919   CL 104 08/12/2011 0753   CO2 30 02/02/2012 0919   CO2 25 08/12/2011 0753   BUN 12 02/02/2012 0919   BUN 11 08/12/2011 0753   CREATININE 0.9 02/02/2012 0919   CREATININE 0.78 08/12/2011 0753      Component Value Date/Time   CALCIUM 9.4 02/02/2012 0919   CALCIUM 9.6 08/12/2011 0753   ALKPHOS 96* 02/02/2012 0919  ALKPHOS 93 02/03/2011 1308   ALKPHOS 93 02/03/2011 1308   AST 28 02/02/2012 0919   AST 20 02/03/2011 1308   AST 20 02/03/2011 1308   ALT 15 02/03/2011 1308   ALT 15 02/03/2011 1308   BILITOT 0.60 02/02/2012 0919   BILITOT 0.4 02/03/2011 1308   BILITOT 0.4 02/03/2011 1308       RADIOGRAPHIC STUDIES: Ct Chest W Contrast  02/02/2012  *RADIOLOGY REPORT*  Clinical Data:  Lymphoma diagnosed 04/2010, chemotherapy and XRT complete, for restaging  CT CHEST, ABDOMEN AND  PELVIS WITH CONTRAST  Technique:  Multidetector CT imaging of the chest, abdomen and pelvis was performed following the standard protocol during bolus administration of intravenous contrast.  Contrast: OMNIPAQUE IOHEXOL 300 MG/ML  SOLN  Comparison:  PET CT dated 08/02/2011   CT CHEST  Findings:  Lungs are essentially clear.  No suspicious pulmonary nodules.  No pleural effusion or pneumothorax.  Visualized thyroid is unremarkable.  The heart is normal in size.  No pericardial effusion.  Vague, hazy soft tissue along the superior aspect of the anterior mediastinum (series 2/image 14), likely reflecting treated lymphoma given lack of significant hypermetabolism on prior PET-CTs.  No suspicious mediastinal, hilar, or axillary lymphadenopathy.  Mild degenerative changes of the thoracic spine.  IMPRESSION: Stable mild anterior mediastinal soft tissue, likely reflecting treated lymphoma, as described above.  Otherwise, no evidence of lymphomatous involvement in the chest.   CT ABDOMEN AND PELVIS  Findings:  Small hiatal hernia.  Liver, spleen pancreas, and adrenal glands within normal limits.  Gallbladder is underdistended.  No intrahepatic or extrahepatic ductal dilatation.  Kidneys within normal limits.  No hydronephrosis.  No evidence of bowel obstruction.  Duodenal diverticulum.  Appendix is mildly prominent, measuring up to 10 mm in its midportion (series 2/image 83), but without associate inflammatory changes to suggest appendicitis.  No evidence of abdominal aortic aneurysm.  No abdominopelvic ascites.  Scattered tiny retroperitoneal nodes measuring up to 6 mm short axis (series 2/image 66).  No suspicious abdominopelvic lymphadenopathy.  Uterus and bilateral ovaries are unremarkable.  Bladder is within normal limits.  Visualized osseous structures are within normal limits.  IMPRESSION: No evidence of lymphomatous involvement in the abdomen/pelvis.  Original Report Authenticated By: Charline Bills, M.D.      ASSESSMENT: This is a very pleasant 46 years old African American female with history of stage II bulky large B-cell non-Hodgkin lymphoma status post 4 cycles of systemic chemotherapy with CHOP/Rituxan followed by curative radiotherapy to the mediastinum. The patient is doing fine and she has no evidence for disease recurrence.  PLAN: I discussed the scan results with the patient and recommended for her continuous observation for now. I would see her back for followup visit in 6 months with repeat CT scan of the chest, abdomen and pelvis. The patient was advised to call me immediately if she has any concerning symptoms in the interval.  All questions were answered. The patient knows to call the clinic with any problems, questions or concerns. We can certainly see the patient much sooner if necessary.

## 2012-02-07 NOTE — Telephone Encounter (Signed)
gave patient apppointment for 08-08-2011 labs and scans gave patient appointment 01-22--2014 at 9:00am printed out calendar and gave to the patient with contrast

## 2012-03-21 ENCOUNTER — Telehealth: Payer: Self-pay | Admitting: *Deleted

## 2012-03-21 NOTE — Telephone Encounter (Signed)
Pt stated that she wanted to see if Dr Donnald Garre would do a preservice appeal for bariatric surgery.  Informed her that she would need to contact the bariatric surgery dept to get assistance with this, Dr Donnald Garre did not refer her for bariatric surgery, only advised she contact Dr Ezzard Standing herself.   SLJ

## 2012-04-20 ENCOUNTER — Other Ambulatory Visit: Payer: Self-pay | Admitting: Internal Medicine

## 2012-04-20 DIAGNOSIS — Z1231 Encounter for screening mammogram for malignant neoplasm of breast: Secondary | ICD-10-CM

## 2012-05-11 IMAGING — CT CT ANGIO CHEST
2 of 10 series · 17 of 36 positions shown · IV contrast (APPLIED)
Comparison: None

CLINICAL DATA: Chest pain, chest tightness, bilateral lower and
swelling

CT ANGIOGRAPHY CHEST WITH CONTRAST
TECHNIQUE: Multidetector CT imaging of the chest was performed
using the standard protocol during bolus administration of
intravenous contrast.  Multiplanar CT image reconstructions
including MIPs were obtained to evaluate the vascular anatomy.
Contrast:  100 ml Amnipaque-ODD IV

[Series 15: thins · axial · 0.73mm/px · z∈[+1198,+1418]mm · 16 of 249 slices shown]
[im 15/249  lung]
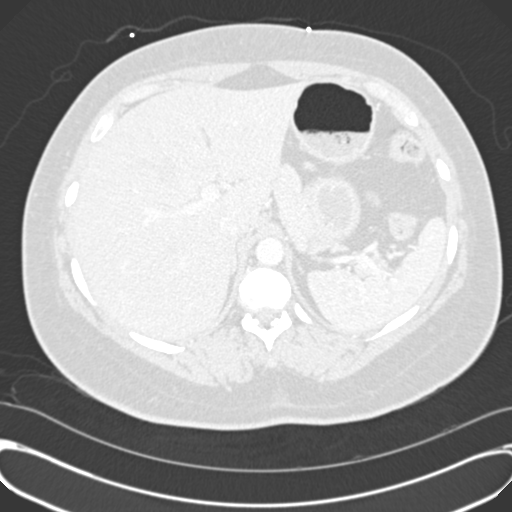
[im 30/249  mediastinal]
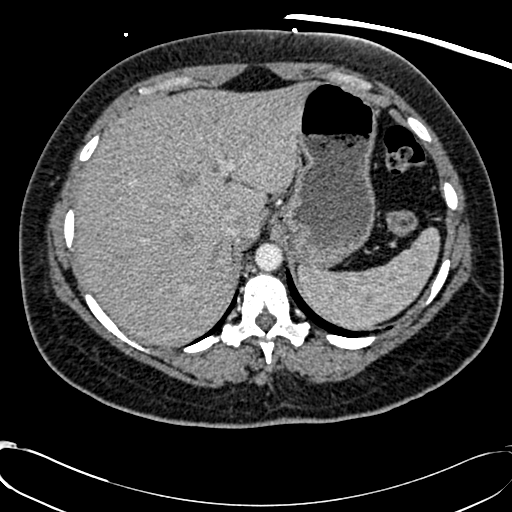
[im 44/249  lung]
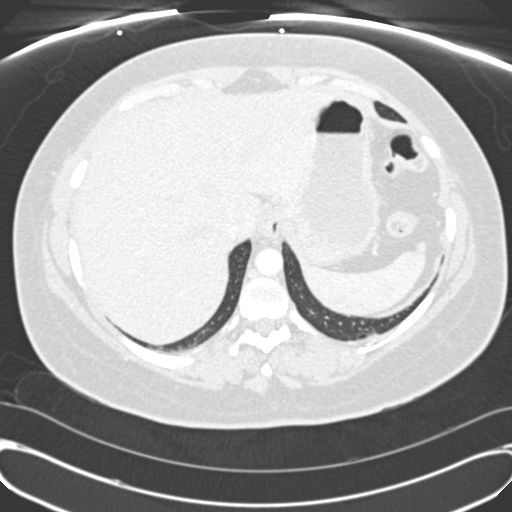
[im 59/249  mediastinal]
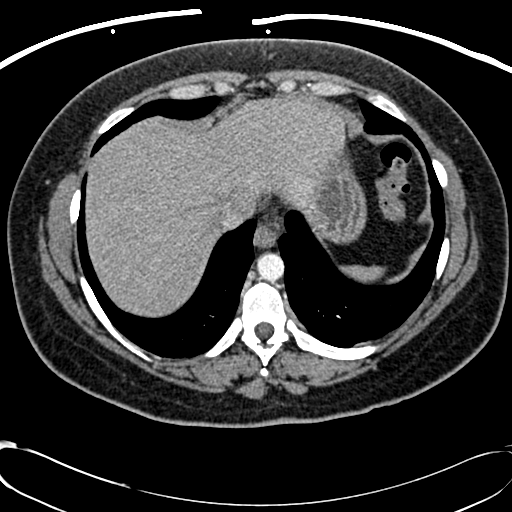
[im 73/249  lung]
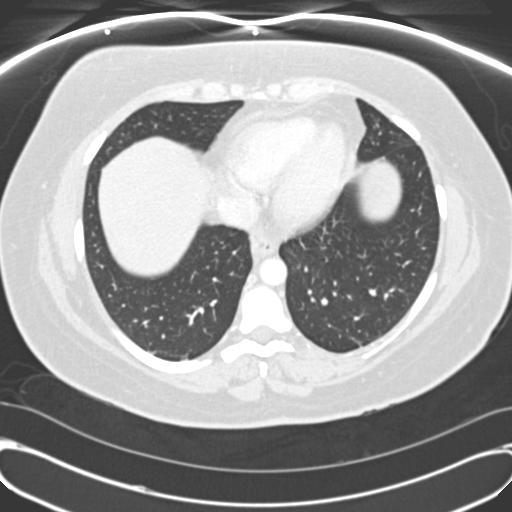
[im 88/249  mediastinal]
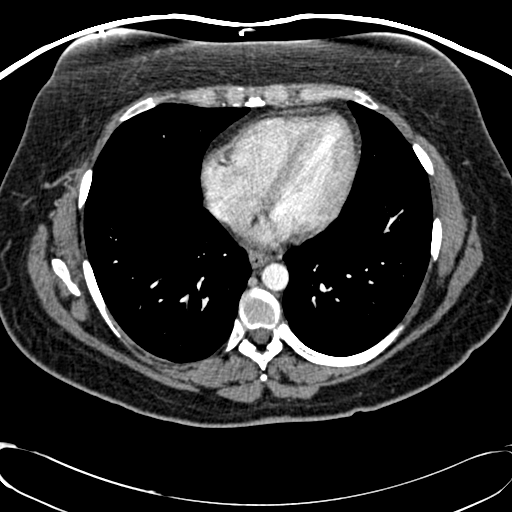
[im 103/249  lung]
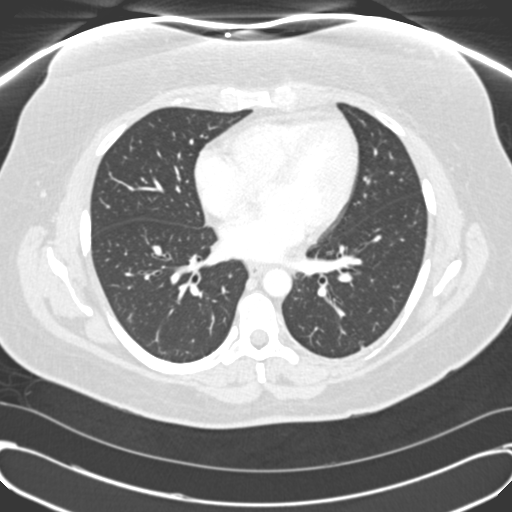
[im 117/249  mediastinal]
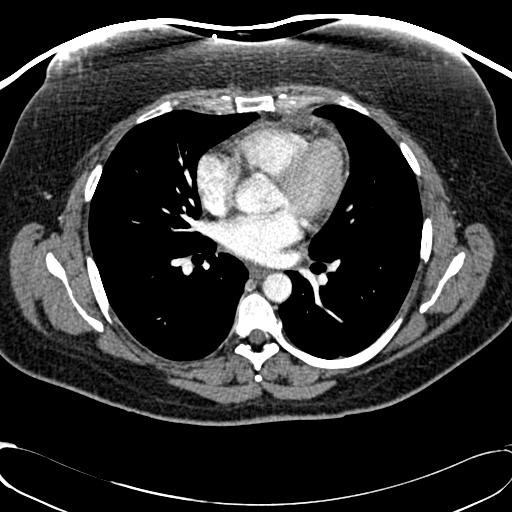
[im 132/249  lung]
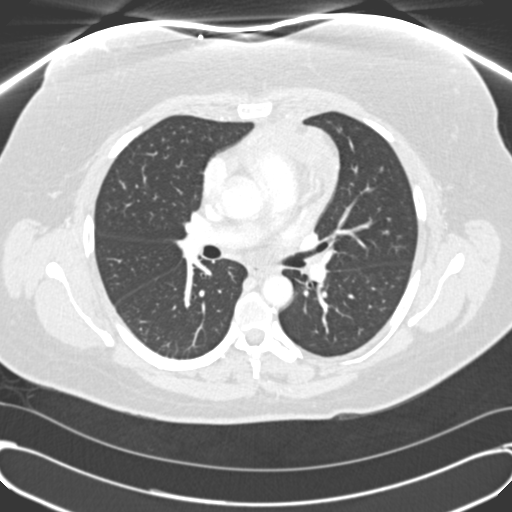
[im 146/249  mediastinal]
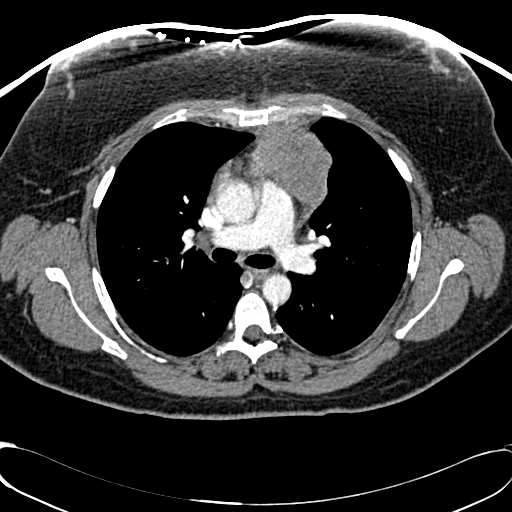
[im 161/249  lung]
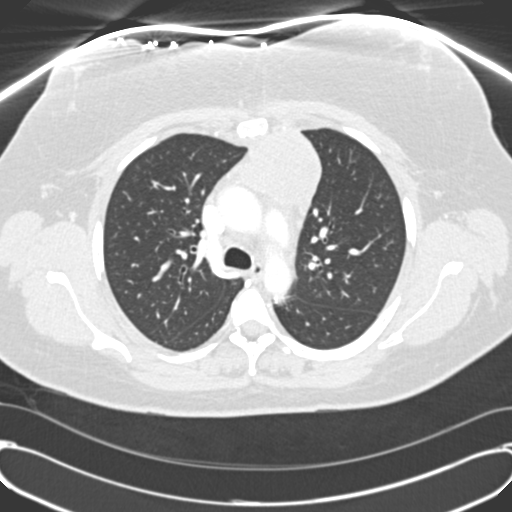
[im 176/249  mediastinal]
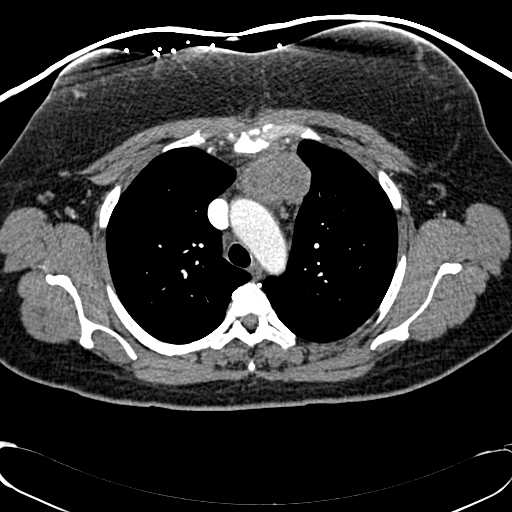
[im 190/249  lung]
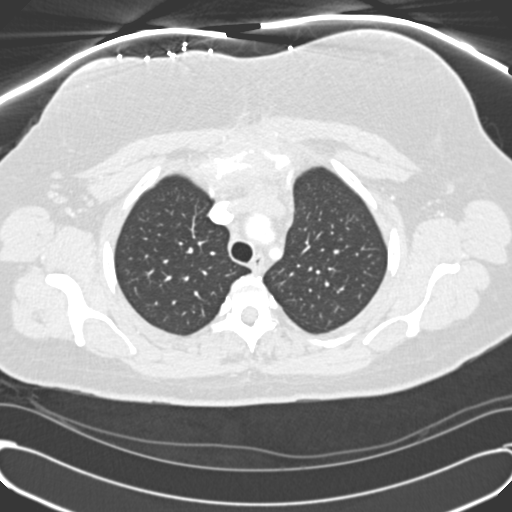
[im 205/249  mediastinal]
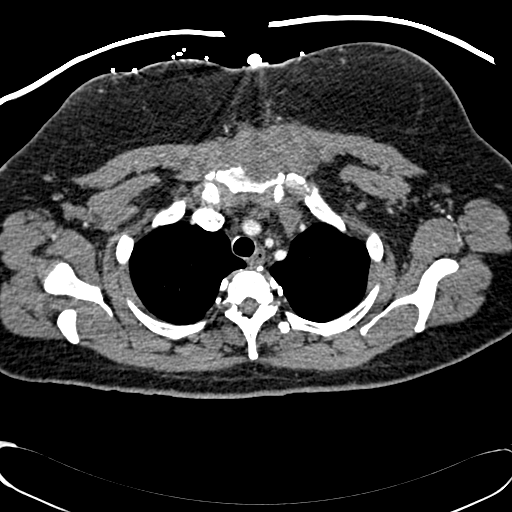
[im 219/249  lung]
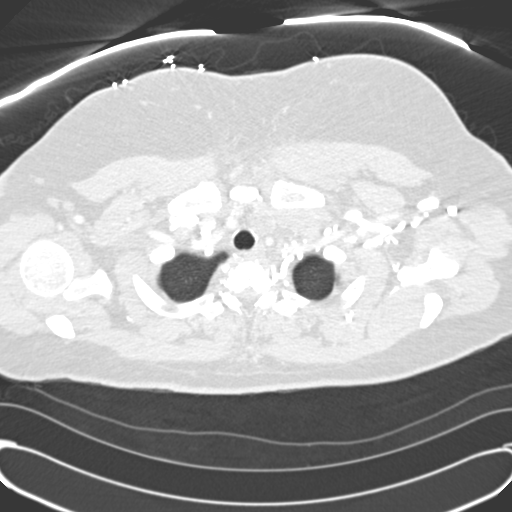
[im 234/249  mediastinal]
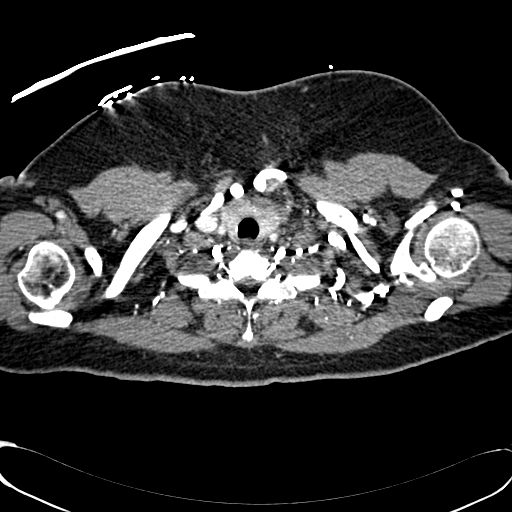

[Series 605: mpr coronals · coronal · 0.73mm/px · 1 of 141 slices shown]
[im 71/141  mediastinal]
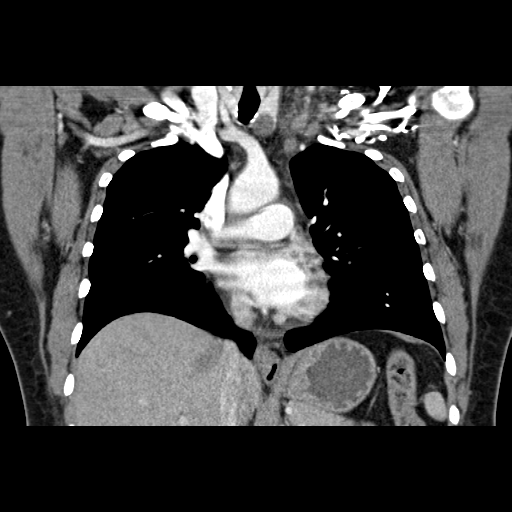

[17 of 36 positions shown; findings below may reference images not displayed]

FINDINGS: Aorta normal caliber without aneurysm or dissection.
Pulmonary arteries patent.
No evidence pulmonary embolism.
Visualized portion of upper abdomen grossly unremarkable.
Soft tissue mass anterior mediastinum, 6 x 1 x 5.1 cm axial
dimensions image 32, 5.7 cm length.
Additional soft tissue mass with bone destruction involving
manubrium, difficult to determine distinct margins but
approximately measuring 6.7 x 4.0 x 3.8 cm.
Soft tissue nodule inferior pole left thyroid lobe 2.0 x 1.7 cm
image 10.
No definite axillary or hilar adenopathy.
Minimal pericardial fluid.
Lungs clear.
No additional osseous abnormalities.

Review of the MIP images confirms the above findings.
IMPRESSION: No evidence pulmonary embolism.
Anterior mediastinal soft tissue mass 6.1 x 5.1 x 5.7 cm in size,
with differential diagnosis including thymoma, teratoma, lymphoma,
thyroid malignancy or metastatic disease.
Additional destructive soft tissue lesion involving manubrium.
Tiny left thyroid nodule 2.0 x 1.7 cm size.

Findings called to Dr. Olander on 04/18/2010 at 7600 hours.

## 2012-05-12 IMAGING — US US SOFT TISSUE HEAD/NECK
1 series · 13 of 25 positions shown · non-contrast
Comparison: CT chest dated 04/18/2010

CLINICAL DATA: Left thyroid nodule by chest CT.  Additional chest
CT findings of the chest wall and mediastinal masses.

THYROID ULTRASOUND
TECHNIQUE: Ultrasound examination of the thyroid gland and adjacent
soft tissues was performed.

[Series 1: us soft tissue head/neck · 0.07mm/px · 13 of 51 slices shown]
[im 1/51]
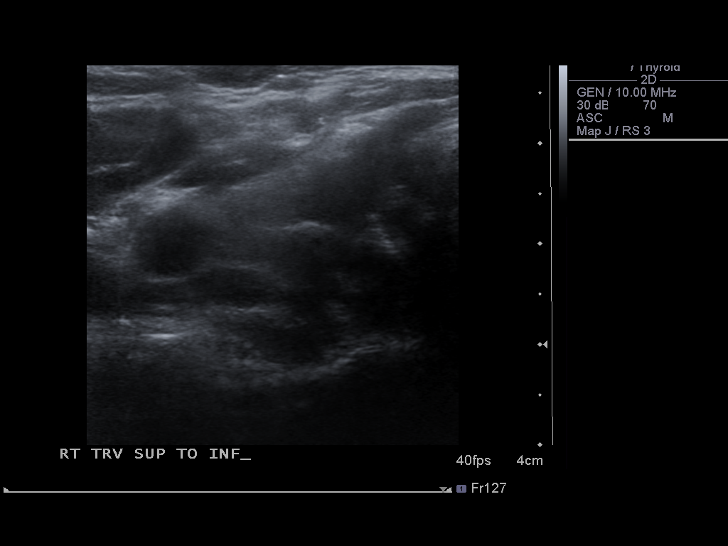
[im 5/51]
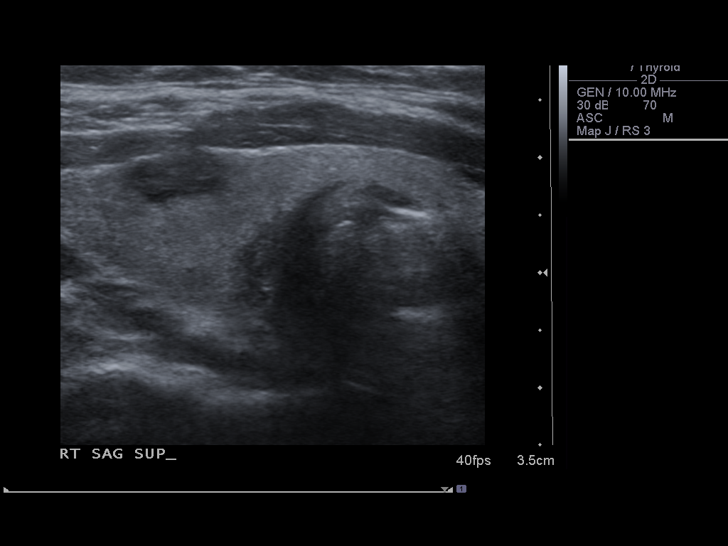
[im 9/51]
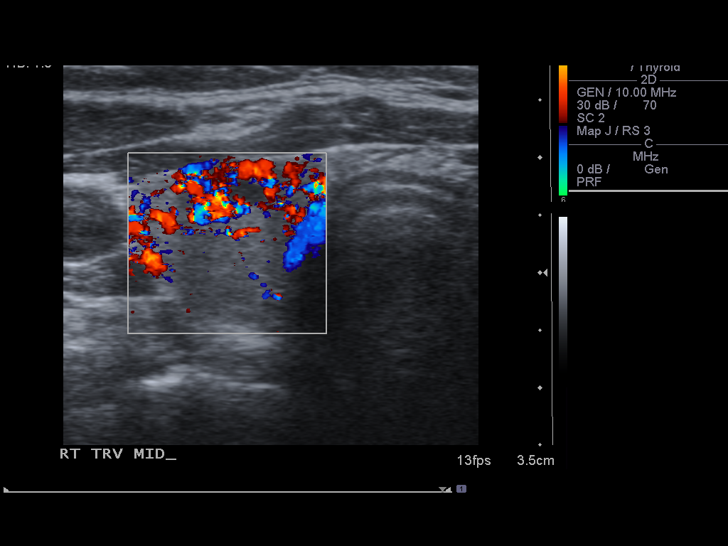
[im 13/51]
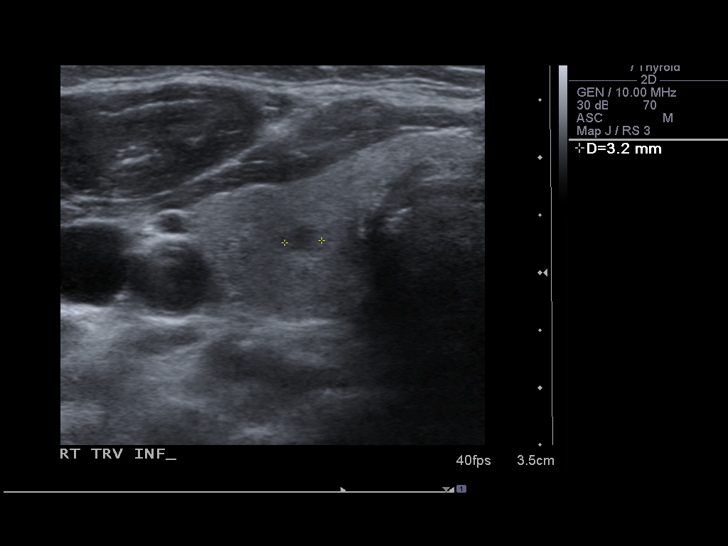
[im 17/51]
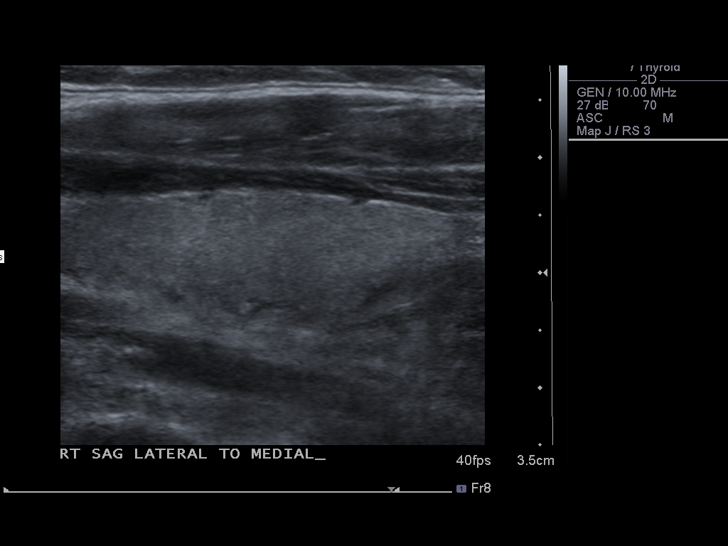
[im 21/51]
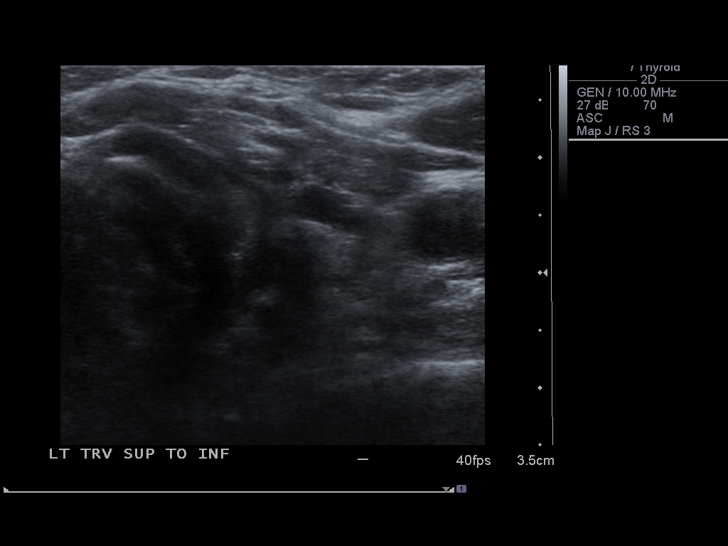
[im 26/51]
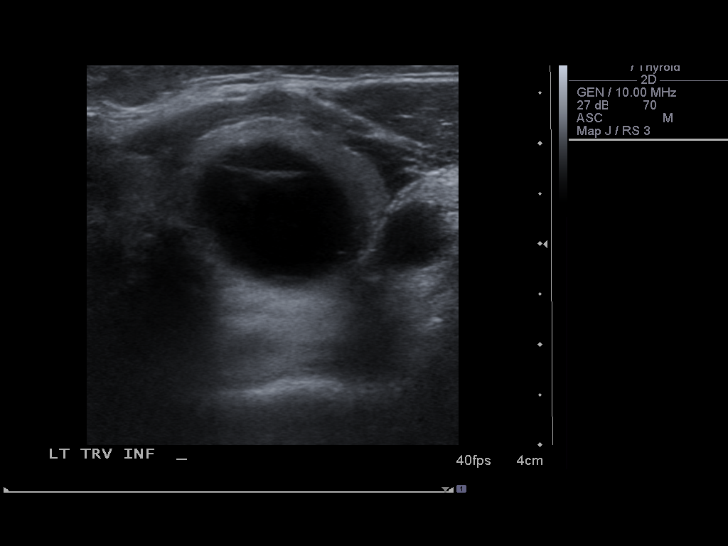
[im 30/51]
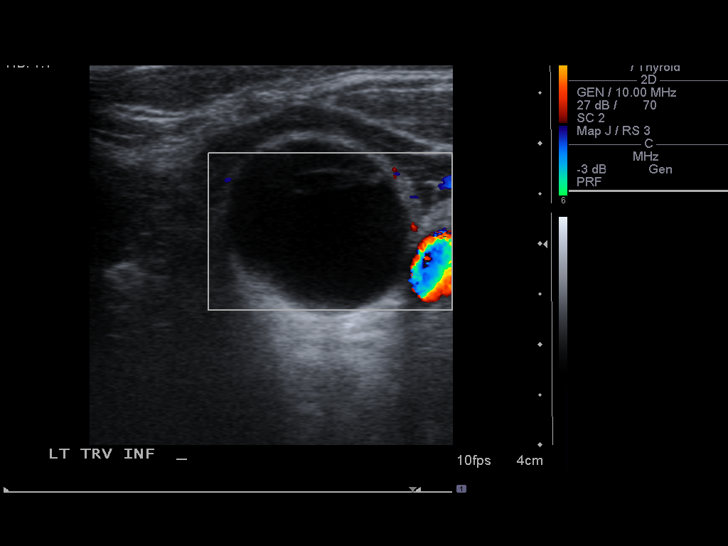
[im 34/51]
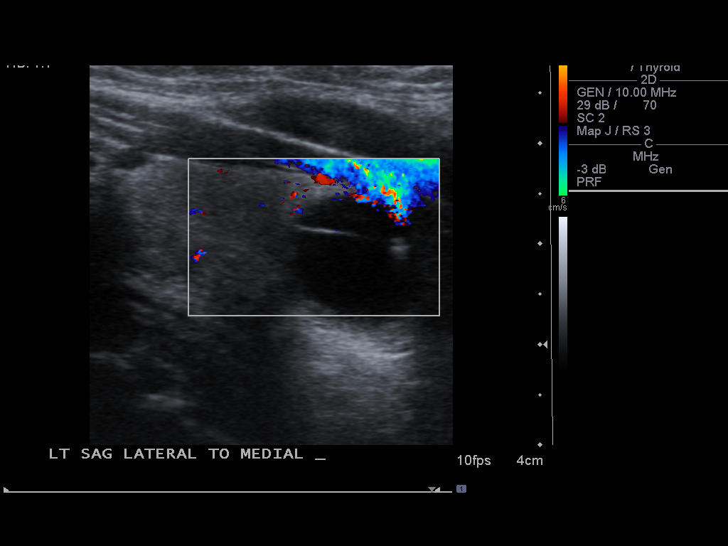
[im 38/51]
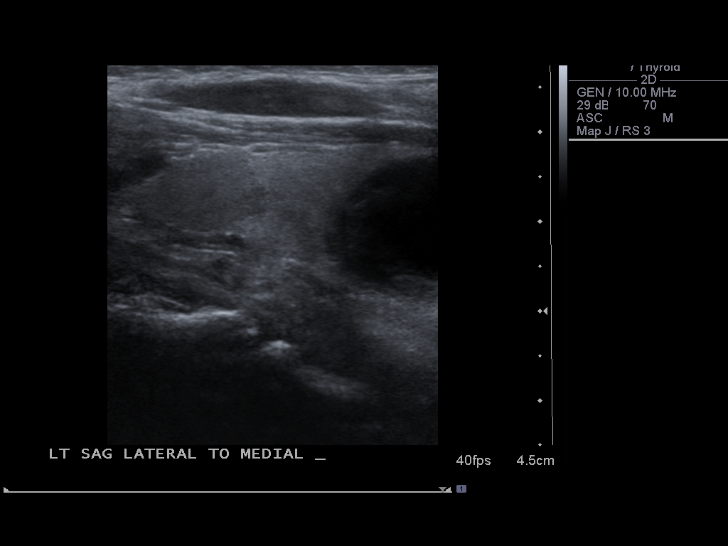
[im 42/51]
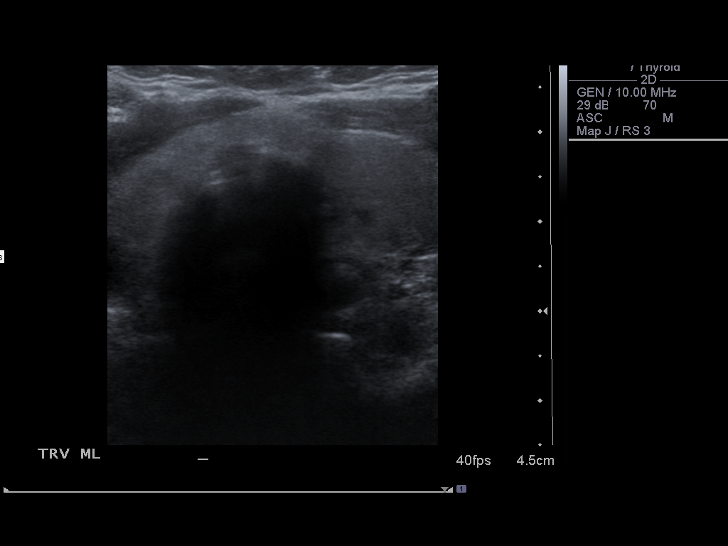
[im 46/51]
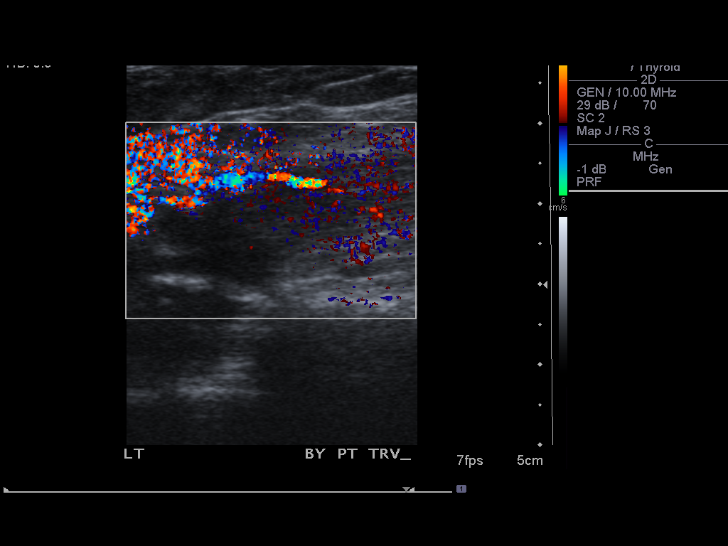
[im 51/51]
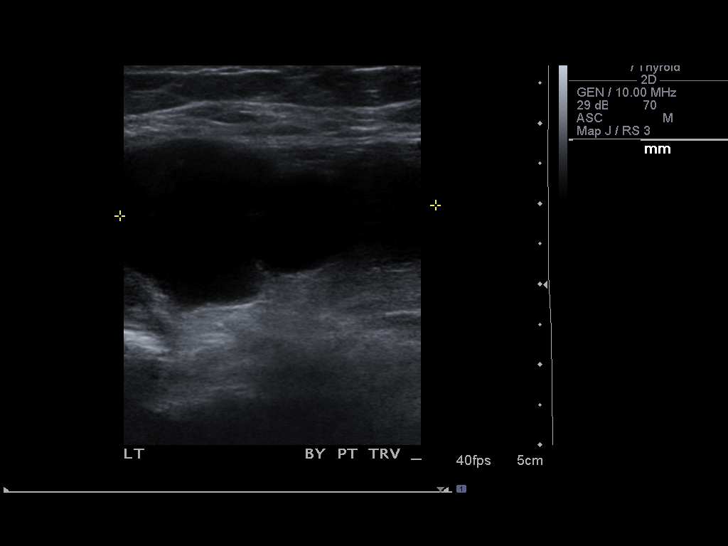

[13 of 25 positions shown; findings below may reference images not displayed]

FINDINGS: Right thyroid lobe:  3.9 x 1.4 x 1.6 cm
Left thyroid lobe:  5.1 x 1.6 x 1.5 cm
Isthmus:  0.2 cm

Focal nodules:  Complex cyst within the lower pole of the left lobe
of the thyroid gland measures approximately 2.0 x 1.8 x 1.7 cm.
This contains internal septation as well as small area of mural
nodularity superiorly.  The thyroid gland itself is multinodular
with several small hypoechoic nodules present bilaterally.  These
are all under 9 mm in greatest diameter.

Lymphadenopathy:  None visualized.

Additional area of soft tissue abnormality identified in the left
side of the lower neck and upper chest wall in an immediate
supraclavicular location.  Irregularly shaped complex cystic
abnormality present in this region measuring approximately 7.4 x
2.0 x 3.9 cm by ultrasound.  Based on appearance, ultrasound likely
is not completely visualizing the entire area and this may be part
of the area visualized by CT causing soft tissue prominence in the
midline with destruction of the manubrium.
IMPRESSION: Multinodular thyroid goiter with 2 cm complex cyst of the left
lobe.  Additional complex cystic soft tissue abnormality in the
left supraclavicular region extending to the chest wall.

## 2012-05-17 ENCOUNTER — Ambulatory Visit
Admission: RE | Admit: 2012-05-17 | Discharge: 2012-05-17 | Disposition: A | Discharge status: Home / Self Care | Payer: 59 | Source: Ambulatory Visit | Attending: Internal Medicine | Admitting: Internal Medicine

## 2012-05-17 DIAGNOSIS — Z1231 Encounter for screening mammogram for malignant neoplasm of breast: Secondary | ICD-10-CM

## 2012-05-18 ENCOUNTER — Other Ambulatory Visit: Payer: Self-pay | Admitting: Lab

## 2012-05-18 ENCOUNTER — Other Ambulatory Visit: Payer: Self-pay | Admitting: Internal Medicine

## 2012-05-18 DIAGNOSIS — N644 Mastodynia: Secondary | ICD-10-CM

## 2012-05-31 ENCOUNTER — Ambulatory Visit: Payer: 59 | Admitting: Obstetrics and Gynecology

## 2012-06-08 ENCOUNTER — Ambulatory Visit
Admission: RE | Admit: 2012-06-08 | Discharge: 2012-06-08 | Disposition: A | Discharge status: Home / Self Care | Payer: 59 | Source: Ambulatory Visit | Attending: Internal Medicine | Admitting: Internal Medicine

## 2012-06-08 DIAGNOSIS — N644 Mastodynia: Secondary | ICD-10-CM

## 2012-06-13 ENCOUNTER — Encounter: Payer: Self-pay | Admitting: Obstetrics and Gynecology

## 2012-06-13 ENCOUNTER — Ambulatory Visit (INDEPENDENT_AMBULATORY_CARE_PROVIDER_SITE_OTHER): Payer: 59 | Admitting: Obstetrics and Gynecology

## 2012-06-13 VITALS — BP 104/68 | Ht 62.5 in | Wt 261.0 lb

## 2012-06-13 DIAGNOSIS — Z9189 Other specified personal risk factors, not elsewhere classified: Secondary | ICD-10-CM

## 2012-06-13 DIAGNOSIS — R87610 Atypical squamous cells of undetermined significance on cytologic smear of cervix (ASC-US): Secondary | ICD-10-CM

## 2012-06-13 DIAGNOSIS — Z923 Personal history of irradiation: Secondary | ICD-10-CM

## 2012-06-13 DIAGNOSIS — Z124 Encounter for screening for malignant neoplasm of cervix: Secondary | ICD-10-CM

## 2012-06-13 DIAGNOSIS — Z5189 Encounter for other specified aftercare: Secondary | ICD-10-CM

## 2012-06-13 DIAGNOSIS — IMO0002 Reserved for concepts with insufficient information to code with codable children: Secondary | ICD-10-CM | POA: Insufficient documentation

## 2012-06-13 NOTE — Progress Notes (Signed)
Subjective:    Terri Luna is a 46 y.o. female No obstetric history on file. who presents for annual exam.  No menses since 12/11.  Now with hot flashes typically in am.  No insomnia  Last Pap: 05/28/2011  WNL:  ASCUS HPV-not detected Regular Periods:no Contraception: none  Monthly Breast exam:no Tetanus<70yrs:? Nl.Bladder Function:yes Daily BMs:yes Healthy Diet:no Calcium:no Mammogram:yes Date of Mammogram: 06/08/12 @ [facility] Exercise:yes Have often Exercise: occ Seatbelt: yes Abuse at home: no Stressful work:no Sigmoid-colonoscopy: n/a Bone Density: No PCP: Dr. Daphine Deutscher Change in PMH: no change Change in FMH:no change BMI-  The following portions of the patient's history were reviewed and updated as appropriate: allergies, current medications, past family history, past medical history, past social history, past surgical history and problem list.  Review of Systems Pertinent items are noted in HPI. Gastrointestinal:No change in bowel habits, no abdominal pain, no rectal bleeding Genitourinary:negative for dysuria, frequency, hematuria, nocturia and urinary incontinence    Objective:     Ht 5' 2.5" (1.588 m)  Wt 261 lb (118.389 kg)  BMI 46.98 kg/m2  Weight:  Wt Readings from Last 1 Encounters:  06/13/12 261 lb (118.389 kg)     BMI: Body mass index is 46.98 kg/(m^2). General Appearance: Alert, appropriate appearance for age. No acute distress HEENT: Grossly normal Neck / Thyroid: Supple, no masses, nodes or enlargement Lungs: clear to auscultation bilaterally Back: No CVA tenderness Breast Exam: No masses or nodes.No dimpling, nipple retraction or discharge. Cardiovascular: Regular rate and rhythm. S1, S2, no murmur Gastrointestinal: Soft, non-tender, no masses or organomegaly Pelvic Exam: Vulva and vagina appear normal. Bimanual exam reveals normal uterus and adnexa. Exam limited by body habitus Rectovaginal: normal rectal, no masses Lymphatic  Exam: Non-palpable nodes in neck, clavicular, axillary, or inguinal regions Skin: no rash or abnormalities Neurologic: Normal gait and speech, no tremor  Psychiatric: Alert and oriented, appropriate affect.    Urinalysis:Not done    Assessment:    Normal gyn exam Increased risk breast cancer  and thyroid disease secondary to irradiation for lymphoma    Plan:   mammogram pap smear return annually or prn tsh    Dierdre Forth MD

## 2012-06-14 LAB — TSH: TSH: 1.836 u[IU]/mL (ref 0.350–4.500)

## 2012-06-16 LAB — PAP IG W/ RFLX HPV ASCU

## 2012-07-26 ENCOUNTER — Telehealth: Payer: Self-pay | Admitting: Internal Medicine

## 2012-07-26 NOTE — Telephone Encounter (Signed)
pt called in to move her lab/ct on 1/20 to 1/21      done/anne

## 2012-08-07 ENCOUNTER — Ambulatory Visit (HOSPITAL_COMMUNITY): Payer: 59

## 2012-08-07 ENCOUNTER — Other Ambulatory Visit: Payer: 59 | Admitting: Lab

## 2012-08-08 ENCOUNTER — Ambulatory Visit (HOSPITAL_COMMUNITY)
Admission: RE | Admit: 2012-08-08 | Discharge: 2012-08-08 | Disposition: A | Discharge status: Home / Self Care | Payer: BC Managed Care – PPO | Source: Ambulatory Visit | Attending: Internal Medicine | Admitting: Internal Medicine

## 2012-08-08 ENCOUNTER — Other Ambulatory Visit (HOSPITAL_BASED_OUTPATIENT_CLINIC_OR_DEPARTMENT_OTHER): Payer: BC Managed Care – PPO | Admitting: Lab

## 2012-08-08 ENCOUNTER — Encounter (HOSPITAL_COMMUNITY): Payer: Self-pay

## 2012-08-08 DIAGNOSIS — Z9221 Personal history of antineoplastic chemotherapy: Secondary | ICD-10-CM | POA: Insufficient documentation

## 2012-08-08 DIAGNOSIS — Z923 Personal history of irradiation: Secondary | ICD-10-CM | POA: Insufficient documentation

## 2012-08-08 DIAGNOSIS — I82291 Chronic embolism and thrombosis of other thoracic veins: Secondary | ICD-10-CM | POA: Insufficient documentation

## 2012-08-08 DIAGNOSIS — K449 Diaphragmatic hernia without obstruction or gangrene: Secondary | ICD-10-CM | POA: Insufficient documentation

## 2012-08-08 DIAGNOSIS — C8582 Other specified types of non-Hodgkin lymphoma, intrathoracic lymph nodes: Secondary | ICD-10-CM | POA: Insufficient documentation

## 2012-08-08 LAB — COMPREHENSIVE METABOLIC PANEL (CC13)
ALT: 14 U/L (ref 0–55)
AST: 19 U/L (ref 5–34)
CO2: 27 mEq/L (ref 22–29)
Calcium: 9.7 mg/dL (ref 8.4–10.4)
Chloride: 105 mEq/L (ref 98–107)
Creatinine: 0.9 mg/dL (ref 0.6–1.1)
Potassium: 4 mEq/L (ref 3.5–5.1)
Sodium: 141 mEq/L (ref 136–145)
Total Protein: 7.7 g/dL (ref 6.4–8.3)

## 2012-08-08 LAB — CBC WITH DIFFERENTIAL/PLATELET
BASO%: 0.9 % (ref 0.0–2.0)
EOS%: 1.9 % (ref 0.0–7.0)
HCT: 39 % (ref 34.8–46.6)
LYMPH%: 33.2 % (ref 14.0–49.7)
MCH: 29.9 pg (ref 25.1–34.0)
MCHC: 33.8 g/dL (ref 31.5–36.0)
NEUT%: 55.9 % (ref 38.4–76.8)
RBC: 4.42 10*6/uL (ref 3.70–5.45)
lymph#: 1.5 10*3/uL (ref 0.9–3.3)

## 2012-08-08 MED ORDER — IOHEXOL 300 MG/ML  SOLN
100.0000 mL | Freq: Once | INTRAMUSCULAR | Status: AC | PRN
  Administered 2012-08-08: 100 mL via INTRAVENOUS

## 2012-08-09 ENCOUNTER — Ambulatory Visit (HOSPITAL_BASED_OUTPATIENT_CLINIC_OR_DEPARTMENT_OTHER): Payer: BC Managed Care – PPO | Admitting: Internal Medicine

## 2012-08-09 ENCOUNTER — Telehealth: Payer: Self-pay | Admitting: Internal Medicine

## 2012-08-09 ENCOUNTER — Encounter: Payer: Self-pay | Admitting: Internal Medicine

## 2012-08-09 VITALS — BP 121/84 | HR 100 | Temp 97.5°F | Resp 16 | Ht 62.5 in | Wt 263.0 lb

## 2012-08-09 DIAGNOSIS — C8589 Other specified types of non-Hodgkin lymphoma, extranodal and solid organ sites: Secondary | ICD-10-CM

## 2012-08-09 DIAGNOSIS — C8582 Other specified types of non-Hodgkin lymphoma, intrathoracic lymph nodes: Secondary | ICD-10-CM

## 2012-08-09 NOTE — Telephone Encounter (Signed)
Gave pt appt for lab and MD for July 2014 °

## 2012-08-09 NOTE — Patient Instructions (Signed)
No evidence for disease recurrence on the recent scans. Follow up in 6 months with repeat CT scans.

## 2012-08-09 NOTE — Progress Notes (Signed)
[facility] Telephone:(336) [phone]   Fax:([phone]  OFFICE PROGRESS NOTE  Hal Morales, MD 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   PRIOR THERAPY:  1. Status post 4 cycles of systemic chemotherapy with CHOP/Rituxan, last dose was given July 10, 2010. 2. Status post curative radiotherapy to the mediastinum under the care of Dr. Michell Heinrich completed on October 01, 2010.  CURRENT THERAPY: Observation.  INTERVAL HISTORY: Terri Luna 47 y.o. female returns to the clinic today for routine six-month followup visit. The patient is feeling fine today with no specific complaints. She denied having any significant weight loss or night sweats. She denied having any chest pain, shortness breath, cough or hemoptysis. She had a recent mammogram that was unremarkable for any abnormalities in her breast. The patient  has repeat CT scan of the chest, abdomen and pelvis performed recently and she is here for evaluation and discussion of her scan results.  MEDICAL HISTORY: Past Medical History  Diagnosis Date  . Lymphoma   . Obesity     ALLERGIES:   has no known allergies.  MEDICATIONS:  No current outpatient prescriptions on file.   No current facility-administered medications for this visit.   Facility-Administered Medications Ordered in Other Visits  Medication Dose Route Frequency Provider Last Rate Last Dose  . sodium chloride 0.9 % injection 10 mL  10 mL Intravenous PRN Si Gaul, MD      . sodium chloride 0.9 % injection 10 mL  10 mL Intravenous PRN Si Gaul, MD        SURGICAL HISTORY:  Past Surgical History  Procedure Date  . Portacath placement   . Port-a-cath removal 08/12/2011    Procedure: REMOVAL PORT-A-CATH;  Surgeon: Alleen Borne, MD;  Location: [facility] OR;  Service: Thoracic;  Laterality: Right;    REVIEW OF SYSTEMS:   A comprehensive review of systems was negative.   PHYSICAL EXAMINATION: General appearance: alert, cooperative and no distress Head: Normocephalic, without obvious abnormality, atraumatic Neck: no adenopathy Lymph nodes: Cervical, supraclavicular, and axillary nodes normal. Resp: clear to auscultation bilaterally Cardio: regular rate and rhythm, S1, S2 normal, no murmur, click, rub or gallop GI: soft, non-tender; bowel sounds normal; no masses,  no organomegaly Extremities: extremities normal, atraumatic, no cyanosis or edema  ECOG PERFORMANCE STATUS: 0 - Asymptomatic  Blood pressure 121/84, pulse 100, temperature 97.5 F (36.4 C), temperature source Oral, resp. rate 16, height 5' 2.5" (1.588 m), weight 263 lb (119.296 kg).  LABORATORY DATA: Lab Results  Component Value Date   WBC 4.5 08/08/2012   HGB 13.2 08/08/2012   HCT 39.0 08/08/2012   MCV 88.4 08/08/2012   PLT 239 08/08/2012      Chemistry      Component Value Date/Time   NA 141 08/08/2012 1049   NA 140 02/02/2012 0919   NA 138 08/12/2011 0753   K 4.0 08/08/2012 1049   K 4.4 02/02/2012 0919   K 3.9 08/12/2011 0753   CL 105 08/08/2012 1049   CL 100 02/02/2012 0919   CL 104 08/12/2011 0753   CO2 27 08/08/2012 1049   CO2 30 02/02/2012 0919   CO2 25 08/12/2011 0753   BUN 10.0 08/08/2012 1049   BUN 12 02/02/2012 0919   BUN 11 08/12/2011 0753   CREATININE 0.9 08/08/2012 1049   CREATININE 0.9 02/02/2012 0919   CREATININE 0.78 08/12/2011 0753  Component Value Date/Time   CALCIUM 9.7 08/08/2012 1049   CALCIUM 9.4 02/02/2012 0919   CALCIUM 9.6 08/12/2011 0753   ALKPHOS 98 08/08/2012 1049   ALKPHOS 96* 02/02/2012 0919   ALKPHOS 93 02/03/2011 1308   ALKPHOS 93 02/03/2011 1308   AST 19 08/08/2012 1049   AST 28 02/02/2012 0919   AST 20 02/03/2011 1308   AST 20 02/03/2011 1308   ALT 14 08/08/2012 1049   ALT 15 02/03/2011 1308   ALT 15 02/03/2011 1308   BILITOT 0.58 08/08/2012 1049   BILITOT 0.60 02/02/2012 0919   BILITOT 0.4 02/03/2011 1308    BILITOT 0.4 02/03/2011 1308       RADIOGRAPHIC STUDIES: Ct Chest W Contrast  08/08/2012  *RADIOLOGY REPORT*  Clinical Data:  History of large cell lymphoma.  Restaging scan. Chemotherapy and radiation therapy complete.  CT CHEST, ABDOMEN AND PELVIS WITH CONTRAST  Technique:  Multidetector CT imaging of the chest, abdomen and pelvis was performed following the standard protocol during bolus administration of intravenous contrast.  Contrast: OMNIPAQUE IOHEXOL 300 MG/ML  SOLN  Comparison:  Chest CT 02/02/2012.   CT CHEST  Findings:  Mediastinum: A small amount of amorphous partially calcified soft tissue in the anterior mediastinum, unchanged compared to numerous prior examinations, likely reflecting treated lymphoma.  No mediastinal mass or lymphadenopathy on today's examination.  No hilar lymphadenopathy. Heart size is borderline enlarged. There is no significant pericardial fluid, thickening or pericardial calcification. There is a small hiatal hernia. The left innominate vein is not visualized and is likely chronically occluded.  There are multiple left-sided chest wall collaterals.  This finding is unchanged.  Lungs/Pleura: No suspicious appearing pulmonary nodules or masses are identified.  No acute consolidative airspace disease.  No pleural effusions.  Musculoskeletal: There are no aggressive appearing lytic or blastic lesions noted in the visualized portions of the skeleton.  IMPRESSION:  1. The appearance of the chest is similar to prior examination with a small amount of amorphous partially calcified soft tissue in the anterior mediastinum, likely representing treated lymphoma.  No findings to suggest recurrent disease on today's examination. 2.  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 3.  Small hiatal hernia.   CT ABDOMEN AND PELVIS  Findings:  Abdomen/Pelvis: The appearance of the liver, gallbladder, pancreas, spleen, bilateral  adrenal glands and bilateral kidneys is unremarkable.  Normal appendix.  No ascites or pneumoperitoneum and no pathologic distension of small bowel.  No definite pathologic lymphadenopathy in the abdomen or pelvis.  The uterus and bilateral ovaries are unremarkable in appearance.  Urinary bladder is nearly completely decompressed, but otherwise unremarkable in appearance.  Musculoskeletal: There are no aggressive appearing lytic or blastic lesions noted in the visualized portions of the skeleton.  IMPRESSION:  1.  No findings to suggest recurrence of lymphoma in the abdomen or pelvis.   Original Report Authenticated By: Trudie Reed, M.D.      ASSESSMENT: This is a very pleasant 47 years old African American female with history of stage II bulky large B-cell non-Hodgkin lymphoma diagnosed in October of 2011 is status post systemic chemotherapy followed by curative radiation to the residual mediastinal mass. She has no evidence for disease recurrence on the recent scan  PLAN: I discussed the scan and lab result with the patient. I recommended for her to continue on observation for now with repeat CT scan of the chest, abdomen and pelvis in 3 months. The patient was advised to  call immediately if she has any concerning symptoms in the interval.  All questions were answered. The patient knows to call the clinic with any problems, questions or concerns. We can certainly see the patient much sooner if necessary.

## 2013-02-02 ENCOUNTER — Other Ambulatory Visit (HOSPITAL_BASED_OUTPATIENT_CLINIC_OR_DEPARTMENT_OTHER): Payer: BC Managed Care – PPO

## 2013-02-02 ENCOUNTER — Ambulatory Visit (HOSPITAL_COMMUNITY)
Admission: RE | Admit: 2013-02-02 | Discharge: 2013-02-02 | Disposition: A | Discharge status: Home / Self Care | Payer: BC Managed Care – PPO | Source: Ambulatory Visit | Attending: Internal Medicine | Admitting: Internal Medicine

## 2013-02-02 DIAGNOSIS — M47817 Spondylosis without myelopathy or radiculopathy, lumbosacral region: Secondary | ICD-10-CM | POA: Insufficient documentation

## 2013-02-02 DIAGNOSIS — M5380 Other specified dorsopathies, site unspecified: Secondary | ICD-10-CM | POA: Insufficient documentation

## 2013-02-02 DIAGNOSIS — Z923 Personal history of irradiation: Secondary | ICD-10-CM | POA: Insufficient documentation

## 2013-02-02 DIAGNOSIS — C8582 Other specified types of non-Hodgkin lymphoma, intrathoracic lymph nodes: Secondary | ICD-10-CM | POA: Insufficient documentation

## 2013-02-02 DIAGNOSIS — Z9221 Personal history of antineoplastic chemotherapy: Secondary | ICD-10-CM | POA: Insufficient documentation

## 2013-02-02 DIAGNOSIS — K5710 Diverticulosis of small intestine without perforation or abscess without bleeding: Secondary | ICD-10-CM | POA: Insufficient documentation

## 2013-02-02 LAB — CBC WITH DIFFERENTIAL/PLATELET
Basophils Absolute: 0 10*3/uL (ref 0.0–0.1)
EOS%: 1.6 % (ref 0.0–7.0)
HCT: 39.1 % (ref 34.8–46.6)
HGB: 13.3 g/dL (ref 11.6–15.9)
MCH: 30.4 pg (ref 25.1–34.0)
MONO#: 0.3 10*3/uL (ref 0.1–0.9)
NEUT%: 63.5 % (ref 38.4–76.8)
lymph#: 1.3 10*3/uL (ref 0.9–3.3)

## 2013-02-02 LAB — COMPREHENSIVE METABOLIC PANEL (CC13)
ALT: 15 U/L (ref 0–55)
AST: 19 U/L (ref 5–34)
Chloride: 106 mEq/L (ref 98–109)
Creatinine: 0.8 mg/dL (ref 0.6–1.1)
Sodium: 139 mEq/L (ref 136–145)
Total Bilirubin: 0.36 mg/dL (ref 0.20–1.20)

## 2013-02-02 LAB — LACTATE DEHYDROGENASE (CC13): LDH: 192 U/L (ref 125–245)

## 2013-02-02 MED ORDER — IOHEXOL 300 MG/ML  SOLN
100.0000 mL | Freq: Once | INTRAMUSCULAR | Status: AC | PRN
  Administered 2013-02-02: 100 mL via INTRAVENOUS

## 2013-02-06 ENCOUNTER — Ambulatory Visit (HOSPITAL_BASED_OUTPATIENT_CLINIC_OR_DEPARTMENT_OTHER): Payer: BC Managed Care – PPO | Admitting: Internal Medicine

## 2013-02-06 ENCOUNTER — Encounter: Payer: Self-pay | Admitting: Internal Medicine

## 2013-02-06 VITALS — BP 135/85 | HR 116 | Temp 97.5°F | Resp 18 | Ht 62.0 in | Wt 259.3 lb

## 2013-02-06 DIAGNOSIS — C8582 Other specified types of non-Hodgkin lymphoma, intrathoracic lymph nodes: Secondary | ICD-10-CM

## 2013-02-06 NOTE — Patient Instructions (Signed)
No evidence for disease recurrence on the recent scan. Followup visit in one year with repeat CT scan of the chest 

## 2013-02-06 NOTE — Progress Notes (Signed)
[facility] Telephone:(336) [phone]   [phone]  OFFICE PROGRESS NOTE  Hal Morales, MD 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   PRIOR THERAPY:  1. Status post 4 cycles of systemic chemotherapy with CHOP/Rituxan, last dose was given July 10, 2010. 2. Status post curative radiotherapy to the mediastinum under the care of Dr. Michell Heinrich completed on October 01, 2010.  CURRENT THERAPY: Observation.  INTERVAL HISTORY: Terri Luna 47 y.o. female returns to the clinic today for six-month followup visit. The patient is feeling fine today with no specific complaints. She denied having any significant weight loss or night sweats. She has no chest pain, shortness breath, cough or hemoptysis. She had repeat CT scan of the chest, abdomen and pelvis performed recently and she is here for evaluation and discussion of her scan results.  MEDICAL HISTORY: Past Medical History  Diagnosis Date  . Lymphoma   . Obesity     ALLERGIES:  has No Known Allergies.  MEDICATIONS:  No current outpatient prescriptions on file.   No current facility-administered medications for this visit.   Facility-Administered Medications Ordered in Other Visits  Medication Dose Route Frequency Provider Last Rate Last Dose  . sodium chloride 0.9 % injection 10 mL  10 mL Intravenous PRN Si Gaul, MD      . sodium chloride 0.9 % injection 10 mL  10 mL Intravenous PRN Si Gaul, MD        SURGICAL HISTORY:  Past Surgical History  Procedure Laterality Date  . Portacath placement    . Port-a-cath removal  08/12/2011    Procedure: REMOVAL PORT-A-CATH;  Surgeon: Alleen Borne, MD;  Location: [facility] OR;  Service: Thoracic;  Laterality: Right;    REVIEW OF SYSTEMS:  A comprehensive review of systems was negative.   PHYSICAL EXAMINATION: General appearance: alert,  cooperative and no distress Head: Normocephalic, without obvious abnormality, atraumatic Neck: no adenopathy Lymph nodes: Cervical, supraclavicular, and axillary nodes normal. Resp: clear to auscultation bilaterally Cardio: regular rate and rhythm, S1, S2 normal, no murmur, click, rub or gallop GI: soft, non-tender; bowel sounds normal; no masses,  no organomegaly Extremities: extremities normal, atraumatic, no cyanosis or edema  ECOG PERFORMANCE STATUS: 0 - Asymptomatic  Blood pressure 135/85, pulse 116, temperature 97.5 F (36.4 C), temperature source Oral, resp. rate 18, height 5\' 2"  (1.575 m), weight 259 lb 4.8 oz (117.618 kg).  LABORATORY DATA: Lab Results  Component Value Date   WBC 4.9 02/02/2013   HGB 13.3 02/02/2013   HCT 39.1 02/02/2013   MCV 89.4 02/02/2013   PLT 255 02/02/2013      Chemistry      Component Value Date/Time   NA 139 02/02/2013 0921   NA 140 02/02/2012 0919   NA 138 08/12/2011 0753   K 4.2 02/02/2013 0921   K 4.4 02/02/2012 0919   K 3.9 08/12/2011 0753   CL 105 08/08/2012 1049   CL 100 02/02/2012 0919   CL 104 08/12/2011 0753   CO2 24 02/02/2013 0921   CO2 30 02/02/2012 0919   CO2 25 08/12/2011 0753   BUN 12.2 02/02/2013 0921   BUN 12 02/02/2012 0919   BUN 11 08/12/2011 0753   CREATININE 0.8 02/02/2013 0921   CREATININE 0.9 02/02/2012 0919   CREATININE 0.78 08/12/2011 0753      Component Value Date/Time   CALCIUM 9.6 02/02/2013 0921  CALCIUM 9.4 02/02/2012 0919   CALCIUM 9.6 08/12/2011 0753   ALKPHOS 103 02/02/2013 0921   ALKPHOS 96* 02/02/2012 0919   ALKPHOS 93 02/03/2011 1308   AST 19 02/02/2013 0921   AST 28 02/02/2012 0919   AST 20 02/03/2011 1308   ALT 15 02/02/2013 0921   ALT 15 02/03/2011 1308   BILITOT 0.36 02/02/2013 0921   BILITOT 0.60 02/02/2012 0919   BILITOT 0.4 02/03/2011 1308       RADIOGRAPHIC STUDIES: Ct Chest W Contrast  02/02/2013   *RADIOLOGY REPORT*  Clinical Data:  Large cell lymphoma.  Intrathoracic lymph nodes. Chemotherapy and  radiation therapy completed.  CT CHEST, ABDOMEN AND PELVIS WITH CONTRAST  Technique:  Multidetector CT imaging of the chest, abdomen and pelvis was performed following the standard protocol during bolus administration of intravenous contrast.  Contrast: OMNIPAQUE IOHEXOL 300 MG/ML  SOLN  Comparison:  Multiple exams, including 08/08/2012   CT CHEST  Findings:  No change in slightly nodular band of anterior mediastinal tissue favoring treated lymphoma.  No pathologic hilar or new mediastinal adenopathy.  No internal mammary or pathologic axillary adenopathy.  Small paraesophageal node 0.6 cm in short axis, stable.  The lungs appear clear.  IMPRESSION:  1.  Mildly nodular band of anterior mediastinal tissue favoring treated lymphoma appears unchanged.  No pathologic adenopathy.   CT ABDOMEN AND PELVIS  Findings:  The liver, spleen, pancreas, and adrenal glands appear unremarkable.  Gallbladder mildly contracted. No pathologic retroperitoneal or porta hepatis adenopathy is identified.  The kidneys appear unremarkable, as do the proximal ureters.  A periampullary duodenal diverticulum observed.  Appendix normal. No pathologic pelvic adenopathy is identified.  The uterus and adnexa appear unremarkable.  The sacroiliac joint spurring appears stable.  Lower lumbar degenerative facet arthropathy noted.  IMPRESSION:  1.  No findings of intra-abdominal malignancy. 2.  Periampullary duodenal diverticulum. 3.  Chronic sacroiliac joint spurring. 4.  Mild lower lumbar degenerative facet arthropathy.   Original Report Authenticated By: Gaylyn Rong, M.D.   ASSESSMENT AND PLAN: This is a very pleasant 47 years old African American female with history of stage II large B-cell non-Hodgkin lymphoma status post 4 cycles of systemic chemotherapy with CHOP/Rituxan followed by curative radiotherapy to the mediastinum. She has been on observation for the last few years with no evidence for disease recurrence. I discussed the  scan results with the patient today. I recommended for her to continue on observation with repeat CT scan of the chest, abdomen and pelvis in one year.  The patient was advised to call me immediately she has any concerning symptoms in the interval.  All questions were answered. The patient knows to call the clinic with any problems, questions or concerns. We can certainly see the patient much sooner if necessary.

## 2013-02-08 ENCOUNTER — Telehealth: Payer: Self-pay | Admitting: Internal Medicine

## 2013-02-08 NOTE — Telephone Encounter (Signed)
lvm for pt for 7.2015 appt....advised pt to pick up barium...mailed pt avs

## 2013-05-03 ENCOUNTER — Other Ambulatory Visit: Payer: Self-pay

## 2013-11-06 ENCOUNTER — Other Ambulatory Visit: Payer: Self-pay

## 2013-11-06 DIAGNOSIS — Z1231 Encounter for screening mammogram for malignant neoplasm of breast: Secondary | ICD-10-CM

## 2013-11-23 ENCOUNTER — Encounter (INDEPENDENT_AMBULATORY_CARE_PROVIDER_SITE_OTHER): Payer: Self-pay

## 2013-11-23 ENCOUNTER — Ambulatory Visit
Admission: RE | Admit: 2013-11-23 | Discharge: 2013-11-23 | Disposition: A | Discharge status: Home / Self Care | Payer: BC Managed Care – PPO | Source: Ambulatory Visit

## 2013-11-23 DIAGNOSIS — Z1231 Encounter for screening mammogram for malignant neoplasm of breast: Secondary | ICD-10-CM

## 2014-02-04 ENCOUNTER — Ambulatory Visit (HOSPITAL_COMMUNITY)
Admission: RE | Admit: 2014-02-04 | Discharge: 2014-02-04 | Disposition: A | Discharge status: Home / Self Care | Payer: BC Managed Care – PPO | Source: Ambulatory Visit | Attending: Internal Medicine | Admitting: Internal Medicine

## 2014-02-04 ENCOUNTER — Encounter (HOSPITAL_COMMUNITY): Payer: Self-pay

## 2014-02-04 ENCOUNTER — Other Ambulatory Visit (HOSPITAL_BASED_OUTPATIENT_CLINIC_OR_DEPARTMENT_OTHER): Payer: BC Managed Care – PPO

## 2014-02-04 DIAGNOSIS — K5710 Diverticulosis of small intestine without perforation or abscess without bleeding: Secondary | ICD-10-CM | POA: Insufficient documentation

## 2014-02-04 DIAGNOSIS — C8582 Other specified types of non-Hodgkin lymphoma, intrathoracic lymph nodes: Secondary | ICD-10-CM

## 2014-02-04 DIAGNOSIS — C8589 Other specified types of non-Hodgkin lymphoma, extranodal and solid organ sites: Secondary | ICD-10-CM | POA: Insufficient documentation

## 2014-02-04 DIAGNOSIS — K449 Diaphragmatic hernia without obstruction or gangrene: Secondary | ICD-10-CM | POA: Insufficient documentation

## 2014-02-04 LAB — COMPREHENSIVE METABOLIC PANEL (CC13)
ALK PHOS: 85 U/L (ref 40–150)
ALT: 15 U/L (ref 0–55)
ANION GAP: 9 meq/L (ref 3–11)
AST: 19 U/L (ref 5–34)
Albumin: 3.6 g/dL (ref 3.5–5.0)
BILIRUBIN TOTAL: 0.33 mg/dL (ref 0.20–1.20)
BUN: 11.2 mg/dL (ref 7.0–26.0)
CO2: 25 meq/L (ref 22–29)
CREATININE: 0.8 mg/dL (ref 0.6–1.1)
Calcium: 9.5 mg/dL (ref 8.4–10.4)
Chloride: 106 mEq/L (ref 98–109)
GLUCOSE: 94 mg/dL (ref 70–140)
Potassium: 4.2 mEq/L (ref 3.5–5.1)
SODIUM: 140 meq/L (ref 136–145)
Total Protein: 7.5 g/dL (ref 6.4–8.3)

## 2014-02-04 LAB — CBC WITH DIFFERENTIAL/PLATELET
BASO%: 0.4 % (ref 0.0–2.0)
Basophils Absolute: 0 10*3/uL (ref 0.0–0.1)
EOS ABS: 0.2 10*3/uL (ref 0.0–0.5)
EOS%: 3.9 % (ref 0.0–7.0)
HEMATOCRIT: 39.7 % (ref 34.8–46.6)
HGB: 13.2 g/dL (ref 11.6–15.9)
LYMPH%: 28.9 % (ref 14.0–49.7)
MCH: 29.7 pg (ref 25.1–34.0)
MCHC: 33.2 g/dL (ref 31.5–36.0)
MCV: 89.4 fL (ref 79.5–101.0)
MONO#: 0.4 10*3/uL (ref 0.1–0.9)
MONO%: 6.7 % (ref 0.0–14.0)
NEUT%: 60.1 % (ref 38.4–76.8)
NEUTROS ABS: 3.2 10*3/uL (ref 1.5–6.5)
PLATELETS: 241 10*3/uL (ref 145–400)
RBC: 4.44 10*6/uL (ref 3.70–5.45)
RDW: 13.8 % (ref 11.2–14.5)
WBC: 5.4 10*3/uL (ref 3.9–10.3)
lymph#: 1.6 10*3/uL (ref 0.9–3.3)

## 2014-02-04 LAB — LACTATE DEHYDROGENASE (CC13): LDH: 190 U/L (ref 125–245)

## 2014-02-04 MED ORDER — IOHEXOL 300 MG/ML  SOLN
100.0000 mL | Freq: Once | INTRAMUSCULAR | Status: AC | PRN
Start: 2014-02-04 — End: 2014-02-04
  Administered 2014-02-04: 100 mL via INTRAVENOUS

## 2014-02-06 ENCOUNTER — Telehealth: Payer: Self-pay | Admitting: Internal Medicine

## 2014-02-06 ENCOUNTER — Ambulatory Visit (HOSPITAL_BASED_OUTPATIENT_CLINIC_OR_DEPARTMENT_OTHER): Payer: BC Managed Care – PPO | Admitting: Internal Medicine

## 2014-02-06 ENCOUNTER — Encounter: Payer: Self-pay | Admitting: Internal Medicine

## 2014-02-06 VITALS — BP 129/80 | HR 94 | Temp 98.1°F | Resp 19 | Ht 62.0 in | Wt 260.6 lb

## 2014-02-06 DIAGNOSIS — C8582 Other specified types of non-Hodgkin lymphoma, intrathoracic lymph nodes: Secondary | ICD-10-CM

## 2014-02-06 NOTE — Progress Notes (Signed)
[facility] [phone]   Fax:([phone]  OFFICE PROGRESS NOTE  Eldred Manges, MD 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   PRIOR THERAPY:  1. Status post 4 cycles of systemic chemotherapy with CHOP/Rituxan, last dose was given July 10, 2010. 2. Status post curative radiotherapy to the mediastinum under the care of Dr. Pablo Ledger completed on October 01, 2010.  CURRENT THERAPY: Observation.  INTERVAL HISTORY: Terri Luna 48 y.o. female returns to the clinic today for annual followup visit. She will start the new job at the [employer] system next month. The patient is feeling fine today with no specific complaints. She denied having any significant weight loss or night sweats. She has no chest pain, shortness of breath, cough or hemoptysis. She had repeat CT scan of the chest, abdomen and pelvis performed recently and she is here for evaluation and discussion of her scan results.  MEDICAL HISTORY: Past Medical History  Diagnosis Date  . Lymphoma   . Obesity     ALLERGIES:  has No Known Allergies.  MEDICATIONS:  No current outpatient prescriptions on file.   No current facility-administered medications for this visit.   Facility-Administered Medications Ordered in Other Visits  Medication Dose Route Frequency Provider Last Rate Last Dose  . sodium chloride 0.9 % injection 10 mL  10 mL Intravenous PRN Curt Bears, MD      . sodium chloride 0.9 % injection 10 mL  10 mL Intravenous PRN Curt Bears, MD        SURGICAL HISTORY:  Past Surgical History  Procedure Laterality Date  . Portacath placement    . Port-a-cath removal  08/12/2011    Procedure: REMOVAL PORT-A-CATH;  Surgeon: Gaye Pollack, MD;  Location: [facility] OR;  Service: Thoracic;  Laterality: Right;    REVIEW OF SYSTEMS:  A comprehensive review of  systems was negative.   PHYSICAL EXAMINATION: General appearance: alert, cooperative and no distress Head: Normocephalic, without obvious abnormality, atraumatic Neck: no adenopathy Lymph nodes: Cervical, supraclavicular, and axillary nodes normal. Resp: clear to auscultation bilaterally Cardio: regular rate and rhythm, S1, S2 normal, no murmur, click, rub or gallop GI: soft, non-tender; bowel sounds normal; no masses,  no organomegaly Extremities: extremities normal, atraumatic, no cyanosis or edema  ECOG PERFORMANCE STATUS: 0 - Asymptomatic  There were no vitals taken for this visit.  LABORATORY DATA: Lab Results  Component Value Date   WBC 5.4 02/04/2014   HGB 13.2 02/04/2014   HCT 39.7 02/04/2014   MCV 89.4 02/04/2014   PLT 241 02/04/2014      Chemistry      Component Value Date/Time   NA 140 02/04/2014 0923   NA 140 02/02/2012 0919   NA 138 08/12/2011 0753   K 4.2 02/04/2014 0923   K 4.4 02/02/2012 0919   K 3.9 08/12/2011 0753   CL 105 08/08/2012 1049   CL 100 02/02/2012 0919   CL 104 08/12/2011 0753   CO2 25 02/04/2014 0923   CO2 30 02/02/2012 0919   CO2 25 08/12/2011 0753   BUN 11.2 02/04/2014 0923   BUN 12 02/02/2012 0919   BUN 11 08/12/2011 0753   CREATININE 0.8 02/04/2014 0923   CREATININE 0.9 02/02/2012 0919   CREATININE 0.78 08/12/2011 0753      Component Value Date/Time   CALCIUM 9.5 02/04/2014 0923   CALCIUM 9.4 02/02/2012  0919   CALCIUM 9.6 08/12/2011 0753   ALKPHOS 85 02/04/2014 0923   ALKPHOS 96* 02/02/2012 0919   ALKPHOS 93 02/03/2011 1308   AST 19 02/04/2014 0923   AST 28 02/02/2012 0919   AST 20 02/03/2011 1308   ALT 15 02/04/2014 0923   ALT 25 02/02/2012 0919   ALT 15 02/03/2011 1308   BILITOT 0.33 02/04/2014 0923   BILITOT 0.60 02/02/2012 0919   BILITOT 0.4 02/03/2011 1308       RADIOGRAPHIC STUDIES: Ct Chest W Contrast  02/04/2014   CLINICAL DATA:  Lymphoma staging.  Chemotherapy complete.  EXAM: CT CHEST, ABDOMEN, AND PELVIS WITH CONTRAST  TECHNIQUE:  Multidetector CT imaging of the chest, abdomen and pelvis was performed following the standard protocol during bolus administration of intravenous contrast.  CONTRAST:  175mL OMNIPAQUE IOHEXOL 300 MG/ML  SOLN  COMPARISON:  02/02/2013.  FINDINGS:  CT CHEST FINDINGS  No pathologically enlarged mediastinal, hilar or axillary lymph nodes. Heart size normal. No pericardial effusion. Tiny hiatal hernia. Lymph nodes adjacent to the distal esophagus are subcentimeter in short axis size.  Lungs are clear.  No pleural fluid.  Airway is unremarkable.   CT ABDOMEN AND PELVIS FINDINGS  Liver, gallbladder, adrenal glands, kidneys, spleen, pancreas and stomach are otherwise unremarkable. Specifically, spleen is normal in size. Small duodenal diverticula are incidentally noted. Small bowel, appendix and colon are otherwise unremarkable.  Uterus and ovaries are visualized. Bladder is fairly decompressed. No pathologically enlarged lymph nodes. No free fluid. No worrisome lytic or sclerotic lesions.   IMPRESSION: No evidence of recurrent lymphoma.   Electronically Signed   By: Lorin Picket M.D.   On: 02/04/2014 11:55    ASSESSMENT AND PLAN: This is a very pleasant 48 years old [place] American female with history of stage II large B-cell non-Hodgkin lymphoma status post 4 cycles of systemic chemotherapy with CHOP/Rituxan followed by curative radiotherapy to the mediastinum. She has been on observation for the last four years with no evidence for disease recurrence. I discussed the scan results with the patient today. I recommended for her to continue on observation with repeat CT scan of the chest, abdomen and pelvis in one year.  The patient was advised to call me immediately she has any concerning symptoms in the interval.  All questions were answered. The patient knows to call the clinic with any problems, questions or concerns. We can certainly see the patient much sooner if necessary.  Disclaimer: This note was  dictated with voice recognition software. Similar sounding words can inadvertently be transcribed and may not be corrected upon review.

## 2014-02-06 NOTE — Telephone Encounter (Signed)
gv and printed appt sched and avs fo rpt for July 2016....gv pt barium

## 2014-07-03 ENCOUNTER — Other Ambulatory Visit: Payer: Self-pay | Admitting: Nurse Practitioner

## 2015-01-01 ENCOUNTER — Telehealth: Payer: Self-pay | Admitting: Internal Medicine

## 2015-01-01 NOTE — Telephone Encounter (Signed)
returned call and lvm for pt to call and r/s ct with radiology gv #.

## 2015-01-02 ENCOUNTER — Other Ambulatory Visit: Payer: Self-pay

## 2015-01-02 DIAGNOSIS — Z1231 Encounter for screening mammogram for malignant neoplasm of breast: Secondary | ICD-10-CM

## 2015-01-06 ENCOUNTER — Ambulatory Visit
Admission: RE | Admit: 2015-01-06 | Discharge: 2015-01-06 | Disposition: A | Discharge status: Home / Self Care | Payer: BLUE CROSS/BLUE SHIELD | Source: Ambulatory Visit

## 2015-01-06 DIAGNOSIS — Z1231 Encounter for screening mammogram for malignant neoplasm of breast: Secondary | ICD-10-CM

## 2015-01-16 ENCOUNTER — Telehealth: Payer: Self-pay | Admitting: Internal Medicine

## 2015-01-16 NOTE — Telephone Encounter (Signed)
Patient called and moved 7/18 lab to 7/29 and 7/25 MM to 8/4. Patient has new appointment date/time and will call central radiology scheduling to reschedule her ct scan.

## 2015-02-03 ENCOUNTER — Ambulatory Visit (HOSPITAL_COMMUNITY): Payer: BLUE CROSS/BLUE SHIELD

## 2015-02-03 ENCOUNTER — Other Ambulatory Visit: Payer: BC Managed Care – PPO

## 2015-02-10 ENCOUNTER — Ambulatory Visit: Payer: BC Managed Care – PPO | Admitting: Internal Medicine

## 2015-02-14 ENCOUNTER — Other Ambulatory Visit: Payer: Self-pay | Admitting: Medical Oncology

## 2015-02-14 ENCOUNTER — Encounter (HOSPITAL_COMMUNITY): Payer: Self-pay

## 2015-02-14 ENCOUNTER — Other Ambulatory Visit (HOSPITAL_BASED_OUTPATIENT_CLINIC_OR_DEPARTMENT_OTHER): Payer: BLUE CROSS/BLUE SHIELD

## 2015-02-14 ENCOUNTER — Ambulatory Visit (HOSPITAL_COMMUNITY)
Admission: RE | Admit: 2015-02-14 | Discharge: 2015-02-14 | Disposition: A | Discharge status: Home / Self Care | Payer: BLUE CROSS/BLUE SHIELD | Source: Ambulatory Visit | Attending: Internal Medicine | Admitting: Internal Medicine

## 2015-02-14 DIAGNOSIS — C8582 Other specified types of non-Hodgkin lymphoma, intrathoracic lymph nodes: Secondary | ICD-10-CM

## 2015-02-14 DIAGNOSIS — C8330 Diffuse large B-cell lymphoma, unspecified site: Secondary | ICD-10-CM | POA: Diagnosis present

## 2015-02-14 DIAGNOSIS — C8512 Unspecified B-cell lymphoma, intrathoracic lymph nodes: Secondary | ICD-10-CM | POA: Diagnosis not present

## 2015-02-14 LAB — COMPREHENSIVE METABOLIC PANEL (CC13)
ALK PHOS: 93 U/L (ref 40–150)
ALT: 14 U/L (ref 0–55)
AST: 19 U/L (ref 5–34)
Albumin: 3.5 g/dL (ref 3.5–5.0)
Anion Gap: 6 mEq/L (ref 3–11)
BILIRUBIN TOTAL: 0.35 mg/dL (ref 0.20–1.20)
BUN: 12.5 mg/dL (ref 7.0–26.0)
CALCIUM: 9.2 mg/dL (ref 8.4–10.4)
CO2: 28 meq/L (ref 22–29)
Chloride: 108 mEq/L (ref 98–109)
Creatinine: 0.8 mg/dL (ref 0.6–1.1)
EGFR: >90 mL/min/{1.73_m2} (ref >90)
Glucose: 94 mg/dl (ref 70–140)
POTASSIUM: 4.5 meq/L (ref 3.5–5.1)
Sodium: 141 mEq/L (ref 136–145)
Total Protein: 7 g/dL (ref 6.4–8.3)

## 2015-02-14 LAB — CBC WITH DIFFERENTIAL/PLATELET
BASO%: 0.2 % (ref 0.0–2.0)
Basophils Absolute: 0 10*3/uL (ref 0.0–0.1)
EOS%: 1.9 % (ref 0.0–7.0)
Eosinophils Absolute: 0.1 10*3/uL (ref 0.0–0.5)
HEMATOCRIT: 39.1 % (ref 34.8–46.6)
HEMOGLOBIN: 13.1 g/dL (ref 11.6–15.9)
LYMPH#: 1.5 10*3/uL (ref 0.9–3.3)
LYMPH%: 30.6 % (ref 14.0–49.7)
MCH: 30.3 pg (ref 25.1–34.0)
MCHC: 33.5 g/dL (ref 31.5–36.0)
MCV: 90.5 fL (ref 79.5–101.0)
MONO#: 0.3 10*3/uL (ref 0.1–0.9)
MONO%: 5.7 % (ref 0.0–14.0)
NEUT#: 2.9 10*3/uL (ref 1.5–6.5)
NEUT%: 61.6 % (ref 38.4–76.8)
Platelets: 214 10*3/uL (ref 145–400)
RBC: 4.32 10*6/uL (ref 3.70–5.45)
RDW: 13.6 % (ref 11.2–14.5)
WBC: 4.7 10*3/uL (ref 3.9–10.3)

## 2015-02-14 MED ORDER — IOHEXOL 300 MG/ML  SOLN
100.0000 mL | Freq: Once | INTRAMUSCULAR | Status: AC | PRN
  Administered 2015-02-14: 100 mL via INTRAVENOUS

## 2015-02-17 ENCOUNTER — Telehealth: Payer: Self-pay | Admitting: Internal Medicine

## 2015-02-17 NOTE — Telephone Encounter (Signed)
pt called to confirm appt....pt ok and aware °

## 2015-02-20 ENCOUNTER — Encounter: Payer: Self-pay | Admitting: Internal Medicine

## 2015-02-20 ENCOUNTER — Ambulatory Visit (HOSPITAL_BASED_OUTPATIENT_CLINIC_OR_DEPARTMENT_OTHER): Payer: BLUE CROSS/BLUE SHIELD | Admitting: Internal Medicine

## 2015-02-20 ENCOUNTER — Telehealth: Payer: Self-pay | Admitting: Internal Medicine

## 2015-02-20 VITALS — BP 133/83 | HR 81 | Temp 97.8°F | Resp 18 | Ht 62.0 in | Wt 257.6 lb

## 2015-02-20 DIAGNOSIS — C8512 Unspecified B-cell lymphoma, intrathoracic lymph nodes: Secondary | ICD-10-CM | POA: Diagnosis not present

## 2015-02-20 DIAGNOSIS — C8582 Other specified types of non-Hodgkin lymphoma, intrathoracic lymph nodes: Secondary | ICD-10-CM

## 2015-02-20 NOTE — Progress Notes (Signed)
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   PRIOR THERAPY:  1. Status post 4 cycles of systemic chemotherapy with CHOP/Rituxan, last dose was given July 10, 2010. 2. Status post curative radiotherapy to the mediastinum under the care of Dr. Pablo Ledger completed on October 01, 2010.  CURRENT THERAPY: Observation.  INTERVAL HISTORY: Terri Luna 49 y.o. female returns to the clinic today for annual followup visit. 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 The patient is feeling fine today with no specific complaints. She denied having any significant weight loss or night sweats. She has no chest pain, shortness of breath, cough or hemoptysis. She had repeat CT scan of the chest, abdomen and pelvis performed recently and she is here for evaluation and discussion of her scan results.  MEDICAL HISTORY: Past Medical History  Diagnosis Date  . Obesity   . Lymphoma     ALLERGIES:  has No Known Allergies.  MEDICATIONS:  No current outpatient prescriptions on file.   No current facility-administered medications for this visit.   Facility-Administered Medications Ordered in Other Visits  Medication Dose Route Frequency Provider Last Rate Last Dose  . sodium chloride 0.9 % injection 10 mL  10 mL Intravenous PRN Curt Bears, MD      . sodium chloride 0.9 % injection 10 mL  10 mL Intravenous PRN Curt Bears, MD        SURGICAL HISTORY:  Past Surgical History  Procedure Laterality Date  . Portacath placement    . Port-a-cath removal  08/12/2011    Procedure: REMOVAL PORT-A-CATH;  Surgeon: Gaye Pollack, MD;  Location: [facility] OR;  Service: Thoracic;  Laterality: Right;    REVIEW OF SYSTEMS:  A comprehensive review of systems was negative.    PHYSICAL EXAMINATION: General appearance: alert, cooperative and no distress Head: Normocephalic, without obvious abnormality, atraumatic Neck: no adenopathy Lymph nodes: Cervical, supraclavicular, and axillary nodes normal. Resp: clear to auscultation bilaterally Cardio: regular rate and rhythm, S1, S2 normal, no murmur, click, rub or gallop GI: soft, non-tender; bowel sounds normal; no masses,  no organomegaly Extremities: extremities normal, atraumatic, no cyanosis or edema  ECOG PERFORMANCE STATUS: 0 - Asymptomatic  Blood pressure 133/83, pulse 81, temperature 97.8 F (36.6 C), temperature source Oral, resp. rate 18, height '5\' 2"'$  (1.575 m), weight 257 lb 9.6 oz (116.847 kg), SpO2 100 %.  LABORATORY DATA: Lab Results  Component Value Date   WBC 4.7 02/14/2015   HGB 13.1 02/14/2015   HCT 39.1 02/14/2015   MCV 90.5 02/14/2015   PLT 214 02/14/2015      Chemistry      Component Value Date/Time   NA 141 02/14/2015 0845   NA 140 02/02/2012 0919   NA 138 08/12/2011 0753   K 4.5 02/14/2015 0845   K 4.4 02/02/2012 0919   K 3.9 08/12/2011 0753   CL 105 08/08/2012 1049   CL 100 02/02/2012 0919   CL 104 08/12/2011 0753   CO2 28 02/14/2015 0845   CO2 30 02/02/2012 0919   CO2 25 08/12/2011 0753   BUN 12.5 02/14/2015 0845   BUN 12 02/02/2012 0919   BUN 11 08/12/2011 0753   CREATININE 0.8 02/14/2015 0845   CREATININE 0.9 02/02/2012 0919   CREATININE 0.78 08/12/2011 0753  Component Value Date/Time   CALCIUM 9.2 02/14/2015 0845   CALCIUM 9.4 02/02/2012 0919   CALCIUM 9.6 08/12/2011 0753   ALKPHOS 93 02/14/2015 0845   ALKPHOS 96* 02/02/2012 0919   ALKPHOS 93 02/03/2011 1308   AST 19 02/14/2015 0845   AST 28 02/02/2012 0919   AST 20 02/03/2011 1308   ALT 14 02/14/2015 0845   ALT 25 02/02/2012 0919   ALT 15 02/03/2011 1308   BILITOT 0.35 02/14/2015 0845   BILITOT 0.60 02/02/2012 0919   BILITOT 0.4 02/03/2011 1308       RADIOGRAPHIC STUDIES: Ct Chest W  Contrast  02/14/2015   CLINICAL DATA:  Large cell lymphoma with thoracic involvement.  EXAM: CT CHEST, ABDOMEN, AND PELVIS WITH CONTRAST  TECHNIQUE: Multidetector CT imaging of the chest, abdomen and pelvis was performed following the standard protocol during bolus administration of intravenous contrast.  CONTRAST:  123m OMNIPAQUE IOHEXOL 300 MG/ML  SOLN  COMPARISON:  Multiple exams, including 02/04/2014  FINDINGS: CT CHEST FINDINGS  Mediastinum/Nodes: Stable faint stranding in the anterior mediastinum without discrete anterior mediastinal mass currently. No pathologic thoracic adenopathy. No recurrence of the prior suprasternal notch mass.  Small type 1 hiatal hernia. 9 mm in short axis diameter lymph node adjacent to the distal esophagus, stable.  Lungs/Pleura: Unremarkable  Musculoskeletal: Unremarkable  CT ABDOMEN PELVIS FINDINGS  Hepatobiliary: Unremarkable  Pancreas: Unremarkable  Spleen: Unremarkable  Adrenals/Urinary Tract: Unremarkable  Stomach/Bowel: Unremarkable  Vascular/Lymphatic: Unremarkable  Reproductive: Unremarkable  Other: No supplemental non-categorized findings.  Musculoskeletal: Bilateral facet arthropathy and mild disc bulge at the L4-5 level.  IMPRESSION: 1. No recurrence identified. Faint residual anterior mediastinal stranding but no mass observed. 2. Small type 1 hiatal hernia. 3. Lumbar spondylosis and degenerative disc disease at L4-5.   Electronically Signed   By: WVan ClinesM.D.   On: 02/14/2015 11:33   Ct Abdomen Pelvis W Contrast  02/14/2015   CLINICAL DATA:  Large cell lymphoma with thoracic involvement.  EXAM: CT CHEST, ABDOMEN, AND PELVIS WITH CONTRAST  TECHNIQUE: Multidetector CT imaging of the chest, abdomen and pelvis was performed following the standard protocol during bolus administration of intravenous contrast.  CONTRAST:  1042mOMNIPAQUE IOHEXOL 300 MG/ML  SOLN  COMPARISON:  Multiple exams, including 02/04/2014  FINDINGS: CT CHEST FINDINGS  Mediastinum/Nodes:  Stable faint stranding in the anterior mediastinum without discrete anterior mediastinal mass currently. No pathologic thoracic adenopathy. No recurrence of the prior suprasternal notch mass.  Small type 1 hiatal hernia. 9 mm in short axis diameter lymph node adjacent to the distal esophagus, stable.  Lungs/Pleura: Unremarkable  Musculoskeletal: Unremarkable  CT ABDOMEN PELVIS FINDINGS  Hepatobiliary: Unremarkable  Pancreas: Unremarkable  Spleen: Unremarkable  Adrenals/Urinary Tract: Unremarkable  Stomach/Bowel: Unremarkable  Vascular/Lymphatic: Unremarkable  Reproductive: Unremarkable  Other: No supplemental non-categorized findings.  Musculoskeletal: Bilateral facet arthropathy and mild disc bulge at the L4-5 level.  IMPRESSION: 1. No recurrence identified. Faint residual anterior mediastinal stranding but no mass observed. 2. Small type 1 hiatal hernia. 3. Lumbar spondylosis and degenerative disc disease at L4-5.   Electronically Signed   By: WaVan Clines.D.   On: 02/14/2015 11:33   ASSESSMENT AND PLAN: This is a very pleasant [age]ears old [place] female with history of stage II large B-cell non-Hodgkin lymphoma status post 4 cycles of systemic chemotherapy with CHOP/Rituxan followed by curative radiotherapy to the mediastinum. She has been on observation for the last five years with no evidence for disease recurrence. I discussed the scan results  with the patient today. I recommended for her to continue on observation with repeat CBC, comprehensive metabolic panel and LDH in one year.  The patient was advised to call me immediately she has any concerning symptoms in the interval.  All questions were answered. The patient knows to call the clinic with any problems, questions or concerns. We can certainly see the patient much sooner if necessary.  Disclaimer: This note was dictated with voice recognition software. Similar sounding words can inadvertently be transcribed and may not be  corrected upon review.

## 2015-02-20 NOTE — Telephone Encounter (Signed)
Pt confirmed labs/ov per 08/04 POF, gave pt avs and calendar.... KJ °

## 2015-03-19 ENCOUNTER — Other Ambulatory Visit: Payer: Self-pay | Admitting: General Surgery

## 2015-03-31 ENCOUNTER — Ambulatory Visit
Admission: RE | Admit: 2015-03-31 | Discharge: 2015-03-31 | Disposition: A | Discharge status: Home / Self Care | Payer: BLUE CROSS/BLUE SHIELD | Source: Ambulatory Visit | Attending: General Surgery | Admitting: General Surgery

## 2016-02-10 ENCOUNTER — Telehealth: Payer: Self-pay | Admitting: Internal Medicine

## 2016-02-10 NOTE — Telephone Encounter (Signed)
Called patient to confirm appointment. Left message. Mailed appointment letter and schedule. Merleen Nicely.

## 2016-02-16 ENCOUNTER — Telehealth: Payer: Self-pay | Admitting: Medical Oncology

## 2016-02-16 NOTE — Telephone Encounter (Signed)
I told pt she does not need a scan per [facility].

## 2016-02-18 ENCOUNTER — Telehealth: Payer: Self-pay | Admitting: Medical Oncology

## 2016-02-18 ENCOUNTER — Ambulatory Visit (HOSPITAL_BASED_OUTPATIENT_CLINIC_OR_DEPARTMENT_OTHER): Payer: BLUE CROSS/BLUE SHIELD | Admitting: Internal Medicine

## 2016-02-18 ENCOUNTER — Encounter: Payer: Self-pay | Admitting: Internal Medicine

## 2016-02-18 ENCOUNTER — Telehealth: Payer: Self-pay | Admitting: Internal Medicine

## 2016-02-18 VITALS — BP 113/67 | HR 87 | Temp 98.6°F | Resp 18 | Ht 62.0 in | Wt 179.3 lb

## 2016-02-18 DIAGNOSIS — C8512 Unspecified B-cell lymphoma, intrathoracic lymph nodes: Secondary | ICD-10-CM

## 2016-02-18 DIAGNOSIS — C8582 Other specified types of non-Hodgkin lymphoma, intrathoracic lymph nodes: Secondary | ICD-10-CM

## 2016-02-18 LAB — CBC WITH DIFFERENTIAL/PLATELET
BASO%: 0.2 % (ref 0.0–2.0)
Basophils Absolute: 0 10*3/uL (ref 0.0–0.1)
EOS%: 0.8 % (ref 0.0–7.0)
Eosinophils Absolute: 0 10*3/uL (ref 0.0–0.5)
HEMATOCRIT: 40.4 % (ref 34.8–46.6)
HGB: 13.6 g/dL (ref 11.6–15.9)
LYMPH%: 28.2 % (ref 14.0–49.7)
MCH: 30.7 pg (ref 25.1–34.0)
MCHC: 33.7 g/dL (ref 31.5–36.0)
MCV: 91.2 fL (ref 79.5–101.0)
MONO#: 0.3 10*3/uL (ref 0.1–0.9)
MONO%: 7.2 % (ref 0.0–14.0)
NEUT%: 63.6 % (ref 38.4–76.8)
NEUTROS ABS: 3 10*3/uL (ref 1.5–6.5)
PLATELETS: 197 10*3/uL (ref 145–400)
RBC: 4.43 10*6/uL (ref 3.70–5.45)
RDW: 13.5 % (ref 11.2–14.5)
WBC: 4.8 10*3/uL (ref 3.9–10.3)
lymph#: 1.3 10*3/uL (ref 0.9–3.3)

## 2016-02-18 LAB — COMPREHENSIVE METABOLIC PANEL
ALT: 19 U/L (ref 0–55)
AST: 30 U/L (ref 5–34)
Albumin: 3.7 g/dL (ref 3.5–5.0)
Alkaline Phosphatase: 84 U/L (ref 40–150)
Anion Gap: 9 mEq/L (ref 3–11)
BUN: 20.5 mg/dL (ref 7.0–26.0)
CHLORIDE: 105 meq/L (ref 98–109)
CO2: 27 mEq/L (ref 22–29)
CREATININE: 0.8 mg/dL (ref 0.6–1.1)
Calcium: 9.7 mg/dL (ref 8.4–10.4)
EGFR: >90 mL/min/{1.73_m2} (ref >90)
Glucose: 53 mg/dl — ABNORMAL LOW (ref 70–140)
Potassium: 3.9 mEq/L (ref 3.5–5.1)
SODIUM: 141 meq/L (ref 136–145)
Total Bilirubin: 0.44 mg/dL (ref 0.20–1.20)
Total Protein: 7.4 g/dL (ref 6.4–8.3)

## 2016-02-18 LAB — LACTATE DEHYDROGENASE: LDH: 170 U/L (ref 125–245)

## 2016-02-18 NOTE — Telephone Encounter (Signed)
-----   Message from Curt Bears, MD sent at 02/18/2016  1:36 PM EDT ----- Call patient with the result and make sure. She eats something sweet and check with her PCP for future recommendation.

## 2016-02-18 NOTE — Progress Notes (Signed)
Call patient with the result and make sure. She eats something sweet and check with her PCP for future recommendation.

## 2016-02-18 NOTE — Telephone Encounter (Signed)
Gave pt cal & avs °

## 2016-02-18 NOTE — Progress Notes (Signed)
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   PRIOR THERAPY:  1. Status post 4 cycles of systemic chemotherapy with CHOP/Rituxan, last dose was given July 10, 2010. 2. Status post curative radiotherapy to the mediastinum under the care of Dr. Pablo Ledger completed on October 01, 2010.  CURRENT THERAPY: Observation.  INTERVAL HISTORY: Terri Luna 50 y.o. female returns to the clinic today for annual followup visit. The patient is feeling fine today with no specific complaints. She underwent a gastric sleeve surgery earlier this year and the patient lost more than 70 pounds. She is feeling much better. She denied having any significant night sweats. She has no chest pain, shortness of breath, cough or hemoptysis. She had repeat CBC, complaints metabolic panel and LDH earlier today and she is here for evaluation and discussion of her lab results.  MEDICAL HISTORY: Past Medical History:  Diagnosis Date  . Lymphoma ([place])   . Obesity     ALLERGIES:  has No Known Allergies.  MEDICATIONS:  Current Outpatient Prescriptions  Medication Sig Dispense Refill  . Calcium Carbonate-Vitamin D (CALTRATE 600+D PO) Take 1 capsule by mouth 3 (three) times daily.    . Cyanocobalamin (VITAMIN B 12 PO) Place 1 tablet under the tongue.    . ferrous sulfate 325 (65 FE) MG tablet Take 325 mg by mouth daily with breakfast.    . Multiple Vitamin (MULTIVITAMIN) tablet Take 1 tablet by mouth daily.     No current facility-administered medications for this visit.    Facility-Administered Medications Ordered in Other Visits  Medication Dose Route Frequency Provider Last Rate Last Dose  . sodium chloride 0.9 % injection 10 mL  10 mL Intravenous PRN Curt Bears, MD      . sodium chloride 0.9 % injection 10 mL   10 mL Intravenous PRN Curt Bears, MD        SURGICAL HISTORY:  Past Surgical History:  Procedure Laterality Date  . PORT-A-CATH REMOVAL  08/12/2011   Procedure: REMOVAL PORT-A-CATH;  Surgeon: Gaye Pollack, MD;  Location: [facility];  Service: Thoracic;  Laterality: Right;  . PORTACATH PLACEMENT      REVIEW OF SYSTEMS:  A comprehensive review of systems was negative.   PHYSICAL EXAMINATION: General appearance: alert, cooperative and no distress Head: Normocephalic, without obvious abnormality, atraumatic Neck: no adenopathy Lymph nodes: Cervical, supraclavicular, and axillary nodes normal. Resp: clear to auscultation bilaterally Cardio: regular rate and rhythm, S1, S2 normal, no murmur, click, rub or gallop GI: soft, non-tender; bowel sounds normal; no masses,  no organomegaly Extremities: extremities normal, atraumatic, no cyanosis or edema  ECOG PERFORMANCE STATUS: 0 - Asymptomatic  Blood pressure 113/67, pulse 87, temperature 98.6 F (37 C), temperature source Oral, resp. rate 18, height '5\' 2"'$  (1.575 m), weight 179 lb 4.8 oz (81.3 kg), SpO2 100 %.  LABORATORY DATA: Lab Results  Component Value Date   WBC 4.8 02/18/2016   HGB 13.6 02/18/2016   HCT 40.4 02/18/2016   MCV 91.2 02/18/2016   PLT 197 02/18/2016      Chemistry      Component Value Date/Time   NA 141 02/14/2015 0845   K 4.5 02/14/2015 0845   CL 105 08/08/2012 1049   CO2 28 02/14/2015 0845   BUN 12.5 02/14/2015 0845   CREATININE 0.8  02/14/2015 0845      Component Value Date/Time   CALCIUM 9.2 02/14/2015 0845   ALKPHOS 93 02/14/2015 0845   AST 19 02/14/2015 0845   ALT 14 02/14/2015 0845   BILITOT 0.35 02/14/2015 0845       RADIOGRAPHIC STUDIES: No results found. ASSESSMENT AND PLAN: This is a very pleasant 50 years old [place] American female with history of stage II large B-cell non-Hodgkin lymphoma status post 4 cycles of systemic chemotherapy with CHOP/Rituxan followed by curative radiotherapy to  the mediastinum. She has been on observation for the last6 years with no evidence for disease recurrence. CBC performed earlier today is unremarkable. Comprehensive metabolic panel and LDH are still pending. I recommended for her to continue on observation with repeat CBC, comprehensive metabolic panel and LDH in one year.  The patient was advised to call me immediately she has any concerning symptoms in the interval.  All questions were answered. The patient knows to call the clinic with any problems, questions or concerns. We can certainly see the patient much sooner if necessary.  Disclaimer: This note was dictated with voice recognition software. Similar sounding words can inadvertently be transcribed and may not be corrected upon review.

## 2016-02-18 NOTE — Telephone Encounter (Signed)
Pt.notified

## 2016-08-27 ENCOUNTER — Telehealth: Payer: Self-pay | Admitting: Medical Oncology

## 2016-08-27 NOTE — Telephone Encounter (Signed)
Completed and faxed [employer] form on dx /tx.

## 2017-02-17 ENCOUNTER — Other Ambulatory Visit (HOSPITAL_BASED_OUTPATIENT_CLINIC_OR_DEPARTMENT_OTHER): Payer: BLUE CROSS/BLUE SHIELD

## 2017-02-17 ENCOUNTER — Encounter: Payer: Self-pay | Admitting: Internal Medicine

## 2017-02-17 ENCOUNTER — Ambulatory Visit (HOSPITAL_BASED_OUTPATIENT_CLINIC_OR_DEPARTMENT_OTHER): Payer: BLUE CROSS/BLUE SHIELD | Admitting: Internal Medicine

## 2017-02-17 ENCOUNTER — Telehealth: Payer: Self-pay | Admitting: Internal Medicine

## 2017-02-17 VITALS — BP 120/66 | HR 70 | Temp 98.2°F | Resp 18 | Ht 62.0 in | Wt 178.3 lb

## 2017-02-17 DIAGNOSIS — C8582 Other specified types of non-Hodgkin lymphoma, intrathoracic lymph nodes: Secondary | ICD-10-CM

## 2017-02-17 DIAGNOSIS — C8512 Unspecified B-cell lymphoma, intrathoracic lymph nodes: Secondary | ICD-10-CM

## 2017-02-17 LAB — CBC WITH DIFFERENTIAL/PLATELET
BASO%: 0.5 % (ref 0.0–2.0)
Basophils Absolute: 0 10*3/uL (ref 0.0–0.1)
EOS%: 0.9 % (ref 0.0–7.0)
Eosinophils Absolute: 0 10*3/uL (ref 0.0–0.5)
HCT: 41.3 % (ref 34.8–46.6)
HEMOGLOBIN: 14 g/dL (ref 11.6–15.9)
LYMPH#: 1.4 10*3/uL (ref 0.9–3.3)
LYMPH%: 35.1 % (ref 14.0–49.7)
MCH: 30.5 pg (ref 25.1–34.0)
MCHC: 34 g/dL (ref 31.5–36.0)
MCV: 89.8 fL (ref 79.5–101.0)
MONO#: 0.3 10*3/uL (ref 0.1–0.9)
MONO%: 7.2 % (ref 0.0–14.0)
NEUT#: 2.2 10*3/uL (ref 1.5–6.5)
NEUT%: 56.3 % (ref 38.4–76.8)
Platelets: 222 10*3/uL (ref 145–400)
RBC: 4.6 10*6/uL (ref 3.70–5.45)
RDW: 13.3 % (ref 11.2–14.5)
WBC: 3.9 10*3/uL (ref 3.9–10.3)

## 2017-02-17 LAB — COMPREHENSIVE METABOLIC PANEL
ALT: 17 U/L (ref 0–55)
AST: 25 U/L (ref 5–34)
Albumin: 4 g/dL (ref 3.5–5.0)
Alkaline Phosphatase: 89 U/L (ref 40–150)
Anion Gap: 7 mEq/L (ref 3–11)
BUN: 15.5 mg/dL (ref 7.0–26.0)
CHLORIDE: 105 meq/L (ref 98–109)
CO2: 27 mEq/L (ref 22–29)
CREATININE: 0.8 mg/dL (ref 0.6–1.1)
Calcium: 9.8 mg/dL (ref 8.4–10.4)
EGFR: >90 mL/min/{1.73_m2} (ref >90)
Glucose: 89 mg/dl (ref 70–140)
Potassium: 3.8 mEq/L (ref 3.5–5.1)
Sodium: 140 mEq/L (ref 136–145)
Total Bilirubin: 0.55 mg/dL (ref 0.20–1.20)
Total Protein: 7.6 g/dL (ref 6.4–8.3)

## 2017-02-17 LAB — LACTATE DEHYDROGENASE: LDH: 177 U/L (ref 125–245)

## 2017-02-17 NOTE — Progress Notes (Signed)
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   PRIOR THERAPY:  1. Status post 4 cycles of systemic chemotherapy with CHOP/Rituxan, last dose was given July 10, 2010. 2. Status post curative radiotherapy to the mediastinum under the care of Dr. Pablo Ledger completed on October 01, 2010.  CURRENT THERAPY: Observation.  INTERVAL HISTORY: Terri Luna 51 y.o. female returns to the clinic today for follow-up visit. The patient is feeling fine today with no specific complaints. She denied having any weight loss or night sweats. She denied having any chest pain, shortness of breath, cough or hemoptysis. She denied having any fever or chills. She has no nausea, vomiting, diarrhea or constipation. She is scheduled for colonoscopy tomorrow by Dr. Collene Mares. She is here today for evaluation and repeat blood work.   MEDICAL HISTORY: Past Medical History:  Diagnosis Date  . Lymphoma ([place])   . Obesity     ALLERGIES:  has No Known Allergies.  MEDICATIONS:  Current Outpatient Prescriptions  Medication Sig Dispense Refill  . Calcium Carbonate-Vitamin D (CALTRATE 600+D PO) Take 1 capsule by mouth 3 (three) times daily.    . Cyanocobalamin (VITAMIN B 12 PO) Place 1 tablet under the tongue.    . ferrous sulfate 325 (65 FE) MG tablet Take 325 mg by mouth daily with breakfast.    . Multiple Vitamin (MULTIVITAMIN) tablet Take 1 tablet by mouth daily.     No current facility-administered medications for this visit.    Facility-Administered Medications Ordered in Other Visits  Medication Dose Route Frequency Provider Last Rate Last Dose  . sodium chloride 0.9 % injection 10 mL  10 mL Intravenous PRN Curt Bears, MD      . sodium chloride 0.9 % injection 10 mL  10 mL Intravenous PRN Curt Bears, MD         SURGICAL HISTORY:  Past Surgical History:  Procedure Laterality Date  . PORT-A-CATH REMOVAL  08/12/2011   Procedure: REMOVAL PORT-A-CATH;  Surgeon: Gaye Pollack, MD;  Location: [facility];  Service: Thoracic;  Laterality: Right;  . PORTACATH PLACEMENT      REVIEW OF SYSTEMS:  A comprehensive review of systems was negative.   PHYSICAL EXAMINATION: General appearance: alert, cooperative and no distress Head: Normocephalic, without obvious abnormality, atraumatic Neck: no adenopathy Lymph nodes: Cervical, supraclavicular, and axillary nodes normal. Resp: clear to auscultation bilaterally Back: symmetric, no curvature. ROM normal. No CVA tenderness. Cardio: regular rate and rhythm, S1, S2 normal, no murmur, click, rub or gallop GI: soft, non-tender; bowel sounds normal; no masses,  no organomegaly Extremities: extremities normal, atraumatic, no cyanosis or edema  ECOG PERFORMANCE STATUS: 0 - Asymptomatic  Blood pressure 120/66, pulse 70, temperature 98.2 F (36.8 C), temperature source Oral, resp. rate 18, height 5\' 2"  (1.575 m), weight 178 lb 4.8 oz (80.9 kg), SpO2 100 %.  LABORATORY DATA: Lab Results  Component Value Date   WBC 3.9 02/17/2017   HGB 14.0 02/17/2017   HCT 41.3 02/17/2017   MCV 89.8 02/17/2017   PLT 222 02/17/2017      Chemistry      Component Value Date/Time   NA 141 02/18/2016 0855   K 3.9 02/18/2016 0855   CL 105 08/08/2012 1049   CO2 27 02/18/2016 0855   BUN 20.5 02/18/2016 0855   CREATININE  0.8 02/18/2016 0855      Component Value Date/Time   CALCIUM 9.7 02/18/2016 0855   ALKPHOS 84 02/18/2016 0855   AST 30 02/18/2016 0855   ALT 19 02/18/2016 0855   BILITOT 0.44 02/18/2016 0855       RADIOGRAPHIC STUDIES: No results found. ASSESSMENT AND PLAN: This is a very pleasant 51 years old [place] American female with history of stage II large B-cell non-Hodgkin lymphoma status post 4 cycles of systemic chemotherapy with CHOP/Rituxan followed by  curative radiotherapy to the mediastinum.  The patient is currently on observation and she is feeling fine. CBC today is unremarkable but comprehensive metabolic panel and LDH are still pending. I recommended for her to continue on observation with repeat CBC, comprehensive metabolic panel and LDH in one year. The patient was advised to call me immediately she has any concerning symptoms in the interval. All questions were answered. The patient knows to call the clinic with any problems, questions or concerns. We can certainly see the patient much sooner if necessary. I spent 10 minutes counseling the patient face to face. The total time spent in the appointment was 15 minutes.  Disclaimer: This note was dictated with voice recognition software. Similar sounding words can inadvertently be transcribed and may not be corrected upon review.

## 2017-02-17 NOTE — Telephone Encounter (Signed)
Scheduled appt per 8/2 los - Gave patient AVS and calender per los.

## 2018-01-31 ENCOUNTER — Telehealth: Payer: Self-pay | Admitting: Internal Medicine

## 2018-01-31 NOTE — Telephone Encounter (Signed)
Tried to reach regarding voicemail °

## 2018-02-16 ENCOUNTER — Ambulatory Visit: Payer: BLUE CROSS/BLUE SHIELD | Admitting: Internal Medicine

## 2018-02-16 ENCOUNTER — Other Ambulatory Visit: Payer: BLUE CROSS/BLUE SHIELD

## 2018-02-20 ENCOUNTER — Encounter: Payer: Self-pay | Admitting: Internal Medicine

## 2018-02-20 ENCOUNTER — Inpatient Hospital Stay: Payer: BLUE CROSS/BLUE SHIELD | Attending: Internal Medicine

## 2018-02-20 ENCOUNTER — Telehealth: Payer: Self-pay

## 2018-02-20 ENCOUNTER — Other Ambulatory Visit: Payer: Self-pay | Admitting: Medical Oncology

## 2018-02-20 ENCOUNTER — Inpatient Hospital Stay (HOSPITAL_BASED_OUTPATIENT_CLINIC_OR_DEPARTMENT_OTHER): Payer: BLUE CROSS/BLUE SHIELD | Admitting: Internal Medicine

## 2018-02-20 VITALS — BP 122/74 | HR 89 | Temp 98.4°F | Resp 18 | Ht 62.0 in | Wt 192.1 lb

## 2018-02-20 DIAGNOSIS — Z9221 Personal history of antineoplastic chemotherapy: Secondary | ICD-10-CM

## 2018-02-20 DIAGNOSIS — Z923 Personal history of irradiation: Secondary | ICD-10-CM | POA: Diagnosis not present

## 2018-02-20 DIAGNOSIS — Z8572 Personal history of non-Hodgkin lymphomas: Secondary | ICD-10-CM | POA: Insufficient documentation

## 2018-02-20 DIAGNOSIS — C8582 Other specified types of non-Hodgkin lymphoma, intrathoracic lymph nodes: Secondary | ICD-10-CM

## 2018-02-20 LAB — CBC WITH DIFFERENTIAL (CANCER CENTER ONLY)
BASOS PCT: 0 %
Basophils Absolute: 0 10*3/uL (ref 0.0–0.1)
EOS ABS: 0 10*3/uL (ref 0.0–0.5)
Eosinophils Relative: 1 %
HCT: 39.9 % (ref 34.8–46.6)
Hemoglobin: 13.5 g/dL (ref 11.6–15.9)
LYMPHS ABS: 1.2 10*3/uL (ref 0.9–3.3)
Lymphocytes Relative: 29 %
MCH: 30.8 pg (ref 25.1–34.0)
MCHC: 34 g/dL (ref 31.5–36.0)
MCV: 90.7 fL (ref 79.5–101.0)
Monocytes Absolute: 0.3 10*3/uL (ref 0.1–0.9)
Monocytes Relative: 8 %
Neutro Abs: 2.6 10*3/uL (ref 1.5–6.5)
Neutrophils Relative %: 62 %
Platelet Count: 218 10*3/uL (ref 145–400)
RBC: 4.4 MIL/uL (ref 3.70–5.45)
RDW: 13.5 % (ref 11.2–14.5)
WBC Count: 4.1 10*3/uL (ref 3.9–10.3)

## 2018-02-20 LAB — CMP (CANCER CENTER ONLY)
ALK PHOS: 82 U/L (ref 38–126)
ALT: 16 U/L (ref 0–44)
AST: 23 U/L (ref 15–41)
Albumin: 3.8 g/dL (ref 3.5–5.0)
Anion gap: 9 (ref 5–15)
BILIRUBIN TOTAL: 0.4 mg/dL (ref 0.3–1.2)
BUN: 15 mg/dL (ref 6–20)
CALCIUM: 9.1 mg/dL (ref 8.9–10.3)
CO2: 23 mmol/L (ref 22–32)
Chloride: 107 mmol/L (ref 98–111)
Creatinine: 0.84 mg/dL (ref 0.44–1.00)
GFR, Estimated: >60 mL/min (ref >60)
Glucose, Bld: 71 mg/dL (ref 70–99)
Potassium: 4.1 mmol/L (ref 3.5–5.1)
Sodium: 139 mmol/L (ref 135–145)
TOTAL PROTEIN: 7.1 g/dL (ref 6.5–8.1)

## 2018-02-20 LAB — LACTATE DEHYDROGENASE: LDH: 187 U/L (ref 98–192)

## 2018-02-20 NOTE — Telephone Encounter (Signed)
Printed avs and calender of upcoming appointment. Per 8/5 los

## 2018-02-20 NOTE — Progress Notes (Signed)
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   PRIOR THERAPY:  1. Status post 4 cycles of systemic chemotherapy with CHOP/Rituxan, last dose was given July 10, 2010. 2. Status post curative radiotherapy to the mediastinum under the care of Dr. Pablo Ledger completed on October 01, 2010.  CURRENT THERAPY: Observation.  INTERVAL HISTORY: Terri Luna 52 y.o. female returns to the clinic today for annual follow-up visit.  The patient is feeling fine today with no concerning complaints except for postnasal drainage.  She was seen by her primary care physician and started on treatment with Keflex and prednisone.  She is feeling a little bit better.  She denied having any chest pain, shortness of breath, cough or hemoptysis.  She denied having any fever or chills.  She has no nausea, vomiting, diarrhea or constipation.  She has no concerning weight loss or night sweats.  She is here today for evaluation and repeat blood work.  MEDICAL HISTORY: Past Medical History:  Diagnosis Date  . Lymphoma ([place])   . Obesity     ALLERGIES:  has No Known Allergies.  MEDICATIONS:  Current Outpatient Medications  Medication Sig Dispense Refill  . Calcium Carbonate-Vitamin D (CALTRATE 600+D PO) Take 1 capsule by mouth 3 (three) times daily.    . Cyanocobalamin (VITAMIN B 12 PO) Place 1 tablet under the tongue.    . ferrous sulfate 325 (65 FE) MG tablet Take 325 mg by mouth daily with breakfast.    . Multiple Vitamin (MULTIVITAMIN) tablet Take 1 tablet by mouth daily.     No current facility-administered medications for this visit.    Facility-Administered Medications Ordered in Other Visits  Medication Dose Route Frequency Provider Last Rate Last Dose  . sodium chloride 0.9 % injection 10 mL  10 mL  Intravenous PRN Curt Bears, MD      . sodium chloride 0.9 % injection 10 mL  10 mL Intravenous PRN Curt Bears, MD        SURGICAL HISTORY:  Past Surgical History:  Procedure Laterality Date  . PORT-A-CATH REMOVAL  08/12/2011   Procedure: REMOVAL PORT-A-CATH;  Surgeon: Gaye Pollack, MD;  Location: [facility];  Service: Thoracic;  Laterality: Right;  . PORTACATH PLACEMENT      REVIEW OF SYSTEMS:  A comprehensive review of systems was negative except for: Ears, nose, mouth, throat, and face: positive for nasal congestion   PHYSICAL EXAMINATION: General appearance: alert, cooperative and no distress Head: Normocephalic, without obvious abnormality, atraumatic Neck: no adenopathy Lymph nodes: Cervical, supraclavicular, and axillary nodes normal. Resp: clear to auscultation bilaterally Back: symmetric, no curvature. ROM normal. No CVA tenderness. Cardio: regular rate and rhythm, S1, S2 normal, no murmur, click, rub or gallop GI: soft, non-tender; bowel sounds normal; no masses,  no organomegaly Extremities: extremities normal, atraumatic, no cyanosis or edema  ECOG PERFORMANCE STATUS: 0 - Asymptomatic  Blood pressure 122/74, pulse 89, temperature 98.4 F (36.9 C), temperature source Oral, resp. rate 18, height 5\' 2"  (1.575 m), weight 192 lb 1.6 oz (87.1 kg), SpO2 97 %.  LABORATORY DATA: Lab Results  Component Value Date   WBC 4.1 02/20/2018   HGB 13.5 02/20/2018   HCT 39.9 02/20/2018   MCV 90.7 02/20/2018   PLT 218 02/20/2018      Chemistry  Component Value Date/Time   NA 140 02/17/2017 0853   K 3.8 02/17/2017 0853   CL 105 08/08/2012 1049   CO2 27 02/17/2017 0853   BUN 15.5 02/17/2017 0853   CREATININE 0.8 02/17/2017 0853      Component Value Date/Time   CALCIUM 9.8 02/17/2017 0853   ALKPHOS 89 02/17/2017 0853   AST 25 02/17/2017 0853   ALT 17 02/17/2017 0853   BILITOT 0.55 02/17/2017 0853       RADIOGRAPHIC STUDIES: No results found. ASSESSMENT AND  PLAN: This is a very pleasant 52 years old [place] American female with history of stage II large B-cell non-Hodgkin lymphoma status post 4 cycles of systemic chemotherapy with CHOP/Rituxan followed by curative radiotherapy to the mediastinum.  The patient is feeling well today with no concerning complaints. Repeat CBC and comprehensive metabolic panel performed today were unremarkable. I recommended for the patient to continue on observation with repeat CBC, complaints metabolic panel and LDH in 1 year. She was advised to call immediately if she has any concerning symptoms in the interval. All questions were answered. The patient knows to call the clinic with any problems, questions or concerns. We can certainly see the patient much sooner if necessary. I spent 10 minutes counseling the patient face to face. The total time spent in the appointment was 15 minutes.  Disclaimer: This note was dictated with voice recognition software. Similar sounding words can inadvertently be transcribed and may not be corrected upon review.

## 2018-12-25 ENCOUNTER — Ambulatory Visit: Payer: Self-pay | Admitting: Cardiology

## 2019-02-19 ENCOUNTER — Inpatient Hospital Stay (HOSPITAL_BASED_OUTPATIENT_CLINIC_OR_DEPARTMENT_OTHER): Payer: BC Managed Care – PPO | Admitting: Internal Medicine

## 2019-02-19 ENCOUNTER — Telehealth: Payer: Self-pay | Admitting: Internal Medicine

## 2019-02-19 ENCOUNTER — Other Ambulatory Visit: Payer: Self-pay

## 2019-02-19 ENCOUNTER — Encounter: Payer: Self-pay | Admitting: Internal Medicine

## 2019-02-19 ENCOUNTER — Inpatient Hospital Stay: Payer: BC Managed Care – PPO | Attending: Internal Medicine

## 2019-02-19 VITALS — BP 120/79 | HR 96 | Temp 97.8°F | Resp 16 | Wt 192.6 lb

## 2019-02-19 DIAGNOSIS — Z9221 Personal history of antineoplastic chemotherapy: Secondary | ICD-10-CM | POA: Insufficient documentation

## 2019-02-19 DIAGNOSIS — Z923 Personal history of irradiation: Secondary | ICD-10-CM | POA: Diagnosis not present

## 2019-02-19 DIAGNOSIS — C8582 Other specified types of non-Hodgkin lymphoma, intrathoracic lymph nodes: Secondary | ICD-10-CM

## 2019-02-19 DIAGNOSIS — Z8572 Personal history of non-Hodgkin lymphomas: Secondary | ICD-10-CM | POA: Insufficient documentation

## 2019-02-19 LAB — CBC WITH DIFFERENTIAL (CANCER CENTER ONLY)
Abs Immature Granulocytes: 0.01 10*3/uL (ref 0.00–0.07)
Basophils Absolute: 0 10*3/uL (ref 0.0–0.1)
Basophils Relative: 1 %
Eosinophils Absolute: 0 10*3/uL (ref 0.0–0.5)
Eosinophils Relative: 1 %
HCT: 41 % (ref 36.0–46.0)
Hemoglobin: 13.7 g/dL (ref 12.0–15.0)
Immature Granulocytes: 0 %
Lymphocytes Relative: 30 %
Lymphs Abs: 1.4 10*3/uL (ref 0.7–4.0)
MCH: 29.9 pg (ref 26.0–34.0)
MCHC: 33.4 g/dL (ref 30.0–36.0)
MCV: 89.5 fL (ref 80.0–100.0)
Monocytes Absolute: 0.4 10*3/uL (ref 0.1–1.0)
Monocytes Relative: 8 %
Neutro Abs: 2.8 10*3/uL (ref 1.7–7.7)
Neutrophils Relative %: 60 %
Platelet Count: 242 10*3/uL (ref 150–400)
RBC: 4.58 MIL/uL (ref 3.87–5.11)
RDW: 12.6 % (ref 11.5–15.5)
WBC Count: 4.6 10*3/uL (ref 4.0–10.5)
nRBC: 0 % (ref 0.0–0.2)

## 2019-02-19 LAB — CMP (CANCER CENTER ONLY)
ALT: 25 U/L (ref 0–44)
AST: 32 U/L (ref 15–41)
Albumin: 4.1 g/dL (ref 3.5–5.0)
Alkaline Phosphatase: 76 U/L (ref 38–126)
Anion gap: 10 (ref 5–15)
BUN: 4 mg/dL — ABNORMAL LOW (ref 6–20)
CO2: 24 mmol/L (ref 22–32)
Calcium: 9.7 mg/dL (ref 8.9–10.3)
Chloride: 106 mmol/L (ref 98–111)
Creatinine: 0.89 mg/dL (ref 0.44–1.00)
GFR, Est AFR Am: >60 mL/min (ref >60)
GFR, Estimated: >60 mL/min (ref >60)
Glucose, Bld: 111 mg/dL — ABNORMAL HIGH (ref 70–99)
Potassium: 4 mmol/L (ref 3.5–5.1)
Sodium: 140 mmol/L (ref 135–145)
Total Bilirubin: 0.4 mg/dL (ref 0.3–1.2)
Total Protein: 7.4 g/dL (ref 6.5–8.1)

## 2019-02-19 LAB — LACTATE DEHYDROGENASE: LDH: 206 U/L — ABNORMAL HIGH (ref 98–192)

## 2019-02-19 NOTE — Progress Notes (Signed)
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   PRIOR THERAPY:  1. Status post 4 cycles of systemic chemotherapy with CHOP/Rituxan, last dose was given July 10, 2010. 2. Status post curative radiotherapy to the mediastinum under the care of Dr. Pablo Ledger completed on October 01, 2010.  CURRENT THERAPY: Observation.  INTERVAL HISTORY: Terri Luna 53 y.o. female returns to the clinic today for annual follow-up visit.  The patient is feeling fine today with no concerning complaints.  She denied having any chest pain, shortness of breath, cough or hemoptysis.  The patient denied having any fever or chills.  She has no nausea, vomiting, diarrhea or constipation.  She denied having any headache or visual changes.  She gained few pounds during the [place] pandemic.  She is here today for evaluation and repeat blood work.  MEDICAL HISTORY: Past Medical History:  Diagnosis Date  . Lymphoma ([place])   . Obesity     ALLERGIES:  has No Known Allergies.  MEDICATIONS:  Current Outpatient Medications  Medication Sig Dispense Refill  . Calcium Carbonate-Vitamin D (CALTRATE 600+D PO) Take 1 capsule by mouth 3 (three) times daily.    . Cyanocobalamin (VITAMIN B 12 PO) Place 1 tablet under the tongue.    . ferrous sulfate 325 (65 FE) MG tablet Take 325 mg by mouth daily with breakfast.    . Multiple Vitamin (MULTIVITAMIN) tablet Take 1 tablet by mouth daily.     No current facility-administered medications for this visit.    Facility-Administered Medications Ordered in Other Visits  Medication Dose Route Frequency Provider Last Rate Last Dose  . sodium chloride 0.9 % injection 10 mL  10 mL Intravenous PRN Curt Bears, MD      . sodium chloride 0.9 % injection 10 mL   10 mL Intravenous PRN Curt Bears, MD        SURGICAL HISTORY:  Past Surgical History:  Procedure Laterality Date  . PORT-A-CATH REMOVAL  08/12/2011   Procedure: REMOVAL PORT-A-CATH;  Surgeon: Gaye Pollack, MD;  Location: [facility];  Service: Thoracic;  Laterality: Right;  . PORTACATH PLACEMENT      REVIEW OF SYSTEMS:  A comprehensive review of systems was negative.   PHYSICAL EXAMINATION: General appearance: alert, cooperative and no distress Head: Normocephalic, without obvious abnormality, atraumatic Neck: no adenopathy Lymph nodes: Cervical, supraclavicular, and axillary nodes normal. Resp: clear to auscultation bilaterally Back: symmetric, no curvature. ROM normal. No CVA tenderness. Cardio: regular rate and rhythm, S1, S2 normal, no murmur, click, rub or gallop GI: soft, non-tender; bowel sounds normal; no masses,  no organomegaly Extremities: extremities normal, atraumatic, no cyanosis or edema  ECOG PERFORMANCE STATUS: 0 - Asymptomatic  Blood pressure 120/79, pulse 96, temperature 97.8 F (36.6 C), temperature source Oral, resp. rate 16, weight 192 lb 9.6 oz (87.4 kg), SpO2 100 %.  LABORATORY DATA: Lab Results  Component Value Date   WBC 4.6 02/19/2019   HGB 13.7 02/19/2019   HCT 41.0 02/19/2019   MCV 89.5 02/19/2019   PLT 242 02/19/2019      Chemistry      Component Value Date/Time   NA 139 02/20/2018 0906   NA 140 02/17/2017 0853   K 4.1 02/20/2018 0906   K 3.8 02/17/2017 0853   CL 107  02/20/2018 0906   CL 105 08/08/2012 1049   CO2 23 02/20/2018 0906   CO2 27 02/17/2017 0853   BUN 15 02/20/2018 0906   BUN 15.5 02/17/2017 0853   CREATININE 0.84 02/20/2018 0906   CREATININE 0.8 02/17/2017 0853      Component Value Date/Time   CALCIUM 9.1 02/20/2018 0906   CALCIUM 9.8 02/17/2017 0853   ALKPHOS 82 02/20/2018 0906   ALKPHOS 89 02/17/2017 0853   AST 23 02/20/2018 0906   AST 25 02/17/2017 0853   ALT 16 02/20/2018 0906   ALT 17 02/17/2017 0853    BILITOT 0.4 02/20/2018 0906   BILITOT 0.55 02/17/2017 0853       RADIOGRAPHIC STUDIES: No results found. ASSESSMENT AND PLAN: This is a very pleasant 53 years old [place] American female with history of stage II large B-cell non-Hodgkin lymphoma status post 4 cycles of systemic chemotherapy with CHOP/Rituxan followed by curative radiotherapy to the mediastinum.  The patient is currently doing well with no concerning complaints. Repeat CBC is normal today but comprehensive metabolic panel and LDH are still pending. I recommended for the patient to continue on observation with repeat lab work and follow-up visit in 1 year. She was advised to call immediately if she has any concerning symptoms in the interval. All questions were answered. The patient knows to call the clinic with any problems, questions or concerns. We can certainly see the patient much sooner if necessary. I spent 10 minutes counseling the patient face to face. The total time spent in the appointment was 15 minutes.  Disclaimer: This note was dictated with voice recognition software. Similar sounding words can inadvertently be transcribed and may not be corrected upon review.

## 2019-02-19 NOTE — Telephone Encounter (Signed)
Scheduled appt per 8/03

## 2019-08-29 ENCOUNTER — Telehealth: Payer: Self-pay | Admitting: Medical Oncology

## 2019-08-29 NOTE — Telephone Encounter (Signed)
DOes Terri Luna recommend the COVID vaccine. I left her a message that she does not meet age criteria , but if she was eligible he recommends that she get the vaccine.

## 2020-02-19 ENCOUNTER — Inpatient Hospital Stay: Payer: BC Managed Care – PPO | Admitting: Internal Medicine

## 2020-02-19 ENCOUNTER — Inpatient Hospital Stay: Payer: BC Managed Care – PPO

## 2020-02-25 ENCOUNTER — Encounter: Payer: Self-pay | Admitting: Internal Medicine

## 2020-02-25 ENCOUNTER — Inpatient Hospital Stay (HOSPITAL_BASED_OUTPATIENT_CLINIC_OR_DEPARTMENT_OTHER): Payer: BC Managed Care – PPO | Admitting: Internal Medicine

## 2020-02-25 ENCOUNTER — Inpatient Hospital Stay: Payer: BC Managed Care – PPO | Attending: Internal Medicine

## 2020-02-25 ENCOUNTER — Other Ambulatory Visit: Payer: Self-pay

## 2020-02-25 VITALS — BP 116/80 | HR 89 | Temp 97.8°F | Resp 18 | Ht 62.0 in | Wt 167.3 lb

## 2020-02-25 DIAGNOSIS — Z79899 Other long term (current) drug therapy: Secondary | ICD-10-CM | POA: Diagnosis not present

## 2020-02-25 DIAGNOSIS — C8582 Other specified types of non-Hodgkin lymphoma, intrathoracic lymph nodes: Secondary | ICD-10-CM

## 2020-02-25 DIAGNOSIS — Z8572 Personal history of non-Hodgkin lymphomas: Secondary | ICD-10-CM | POA: Insufficient documentation

## 2020-02-25 DIAGNOSIS — Z9221 Personal history of antineoplastic chemotherapy: Secondary | ICD-10-CM | POA: Diagnosis not present

## 2020-02-25 DIAGNOSIS — Z923 Personal history of irradiation: Secondary | ICD-10-CM | POA: Diagnosis not present

## 2020-02-25 DIAGNOSIS — E669 Obesity, unspecified: Secondary | ICD-10-CM | POA: Diagnosis not present

## 2020-02-25 LAB — CBC WITH DIFFERENTIAL (CANCER CENTER ONLY)
Abs Immature Granulocytes: 0.01 10*3/uL (ref 0.00–0.07)
Basophils Absolute: 0 10*3/uL (ref 0.0–0.1)
Basophils Relative: 1 %
Eosinophils Absolute: 0 10*3/uL (ref 0.0–0.5)
Eosinophils Relative: 1 %
HCT: 40.1 % (ref 36.0–46.0)
Hemoglobin: 13.3 g/dL (ref 12.0–15.0)
Immature Granulocytes: 0 %
Lymphocytes Relative: 31 %
Lymphs Abs: 1.4 10*3/uL (ref 0.7–4.0)
MCH: 30.4 pg (ref 26.0–34.0)
MCHC: 33.2 g/dL (ref 30.0–36.0)
MCV: 91.8 fL (ref 80.0–100.0)
Monocytes Absolute: 0.2 10*3/uL (ref 0.1–1.0)
Monocytes Relative: 5 %
Neutro Abs: 2.8 10*3/uL (ref 1.7–7.7)
Neutrophils Relative %: 62 %
Platelet Count: 227 10*3/uL (ref 150–400)
RBC: 4.37 MIL/uL (ref 3.87–5.11)
RDW: 12.9 % (ref 11.5–15.5)
WBC Count: 4.4 10*3/uL (ref 4.0–10.5)
nRBC: 0 % (ref 0.0–0.2)

## 2020-02-25 LAB — CMP (CANCER CENTER ONLY)
ALT: 10 U/L (ref 0–44)
AST: 18 U/L (ref 15–41)
Albumin: 3.9 g/dL (ref 3.5–5.0)
Alkaline Phosphatase: 82 U/L (ref 38–126)
Anion gap: 7 (ref 5–15)
BUN: 9 mg/dL (ref 6–20)
CO2: 27 mmol/L (ref 22–32)
Calcium: 10 mg/dL (ref 8.9–10.3)
Chloride: 105 mmol/L (ref 98–111)
Creatinine: 0.77 mg/dL (ref 0.44–1.00)
GFR, Est AFR Am: >60 mL/min (ref >60)
GFR, Estimated: >60 mL/min (ref >60)
Glucose, Bld: 92 mg/dL (ref 70–99)
Potassium: 3.6 mmol/L (ref 3.5–5.1)
Sodium: 139 mmol/L (ref 135–145)
Total Bilirubin: 0.4 mg/dL (ref 0.3–1.2)
Total Protein: 7.4 g/dL (ref 6.5–8.1)

## 2020-02-25 LAB — LACTATE DEHYDROGENASE: LDH: 178 U/L (ref 98–192)

## 2020-02-25 NOTE — Progress Notes (Signed)
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   PRIOR THERAPY:  1. Status post 4 cycles of systemic chemotherapy with CHOP/Rituxan, last dose was given July 10, 2010. 2. Status post curative radiotherapy to the mediastinum under the care of Dr. Pablo Ledger completed on October 01, 2010.  CURRENT THERAPY: Observation.  INTERVAL HISTORY: Terri Luna 54 y.o. female returns to the clinic today for follow-up visit.  The patient is feeling fine today with no concerning complaints.  She lost a lot of weight intentionally.  She exercises at regular basis and she is now completely vegetarian.  The patient denied having any chest pain, shortness of breath, cough or hemoptysis.  She denied having any fever or chills.  She has no nausea, vomiting, diarrhea or constipation.  She is here today for evaluation and repeat blood work.  MEDICAL HISTORY: Past Medical History:  Diagnosis Date  . Lymphoma ([place])   . Obesity     ALLERGIES:  has No Known Allergies.  MEDICATIONS:  Current Outpatient Medications  Medication Sig Dispense Refill  . Calcium Carbonate-Vitamin D (CALTRATE 600+D PO) Take 1 capsule by mouth 3 (three) times daily.    . Cyanocobalamin (VITAMIN B 12 PO) Place 1 tablet under the tongue.    . ferrous sulfate 325 (65 FE) MG tablet Take 325 mg by mouth daily with breakfast.    . Multiple Vitamin (MULTIVITAMIN) tablet Take 1 tablet by mouth daily.     No current facility-administered medications for this visit.   Facility-Administered Medications Ordered in Other Visits  Medication Dose Route Frequency Provider Last Rate Last Admin  . sodium chloride 0.9 % injection 10 mL  10 mL Intravenous PRN Curt Bears, MD      . sodium chloride 0.9 % injection 10  mL  10 mL Intravenous PRN Curt Bears, MD        SURGICAL HISTORY:  Past Surgical History:  Procedure Laterality Date  . PORT-A-CATH REMOVAL  08/12/2011   Procedure: REMOVAL PORT-A-CATH;  Surgeon: Gaye Pollack, MD;  Location: [facility];  Service: Thoracic;  Laterality: Right;  . PORTACATH PLACEMENT      REVIEW OF SYSTEMS:  A comprehensive review of systems was negative.   PHYSICAL EXAMINATION: General appearance: alert, cooperative and no distress Head: Normocephalic, without obvious abnormality, atraumatic Neck: no adenopathy Lymph nodes: Cervical, supraclavicular, and axillary nodes normal. Resp: clear to auscultation bilaterally Back: symmetric, no curvature. ROM normal. No CVA tenderness. Cardio: regular rate and rhythm, S1, S2 normal, no murmur, click, rub or gallop GI: soft, non-tender; bowel sounds normal; no masses,  no organomegaly Extremities: extremities normal, atraumatic, no cyanosis or edema  ECOG PERFORMANCE STATUS: 0 - Asymptomatic  Blood pressure 116/80, pulse 89, temperature 97.8 F (36.6 C), temperature source Tympanic, resp. rate 18, height 5\' 2"  (1.575 m), weight 167 lb 4.8 oz (75.9 kg), SpO2 100 %.  LABORATORY DATA: Lab Results  Component Value Date   WBC 4.4 02/25/2020   HGB 13.3 02/25/2020   HCT 40.1 02/25/2020   MCV 91.8 02/25/2020   PLT 227 02/25/2020      Chemistry      Component Value Date/Time   NA 140 02/19/2019 0816   NA 140 02/17/2017 0853   K 4.0 02/19/2019 0816   K 3.8 02/17/2017  0853   CL 106 02/19/2019 0816   CL 105 08/08/2012 1049   CO2 24 02/19/2019 0816   CO2 27 02/17/2017 0853   BUN 4 (L) 02/19/2019 0816   BUN 15.5 02/17/2017 0853   CREATININE 0.89 02/19/2019 0816   CREATININE 0.8 02/17/2017 0853      Component Value Date/Time   CALCIUM 9.7 02/19/2019 0816   CALCIUM 9.8 02/17/2017 0853   ALKPHOS 76 02/19/2019 0816   ALKPHOS 89 02/17/2017 0853   AST 32 02/19/2019 0816   AST 25 02/17/2017 0853   ALT 25 02/19/2019  0816   ALT 17 02/17/2017 0853   BILITOT 0.4 02/19/2019 0816   BILITOT 0.55 02/17/2017 0853       RADIOGRAPHIC STUDIES: No results found. ASSESSMENT AND PLAN: This is a very pleasant 54 years old [place] American female with history of stage II large B-cell non-Hodgkin lymphoma status post 4 cycles of systemic chemotherapy with CHOP/Rituxan followed by curative radiotherapy to the mediastinum.  The patient has been on observation now for almost 10 years and she is feeling fine. Repeat blood work today is unremarkable. I recommended for the patient to continue on observation and she will follow with her primary care physician from now on.  I will be happy to see her in the future if needed. She was advised to call immediately if she has any concerning issues in the future. All questions were answered. The patient knows to call the clinic with any problems, questions or concerns. We can certainly see the patient much sooner if necessary.  Disclaimer: This note was dictated with voice recognition software. Similar sounding words can inadvertently be transcribed and may not be corrected upon review.

## 2022-04-08 DIAGNOSIS — H6991 Unspecified Eustachian tube disorder, right ear: Secondary | ICD-10-CM | POA: Diagnosis not present

## 2022-04-19 DIAGNOSIS — L819 Disorder of pigmentation, unspecified: Secondary | ICD-10-CM | POA: Diagnosis not present

## 2022-04-19 DIAGNOSIS — L723 Sebaceous cyst: Secondary | ICD-10-CM | POA: Diagnosis not present

## 2022-04-19 DIAGNOSIS — M25561 Pain in right knee: Secondary | ICD-10-CM | POA: Diagnosis not present

## 2022-04-19 DIAGNOSIS — Z0000 Encounter for general adult medical examination without abnormal findings: Secondary | ICD-10-CM | POA: Diagnosis not present

## 2022-04-19 DIAGNOSIS — Z23 Encounter for immunization: Secondary | ICD-10-CM | POA: Diagnosis not present

## 2022-04-19 DIAGNOSIS — Z8572 Personal history of non-Hodgkin lymphomas: Secondary | ICD-10-CM | POA: Diagnosis not present

## 2022-05-14 DIAGNOSIS — Z780 Asymptomatic menopausal state: Secondary | ICD-10-CM | POA: Diagnosis not present

## 2022-05-14 DIAGNOSIS — Z01419 Encounter for gynecological examination (general) (routine) without abnormal findings: Secondary | ICD-10-CM | POA: Diagnosis not present

## 2022-05-14 DIAGNOSIS — Z1239 Encounter for other screening for malignant neoplasm of breast: Secondary | ICD-10-CM | POA: Diagnosis not present

## 2022-05-14 DIAGNOSIS — Z124 Encounter for screening for malignant neoplasm of cervix: Secondary | ICD-10-CM | POA: Diagnosis not present

## 2022-05-14 DIAGNOSIS — Z1231 Encounter for screening mammogram for malignant neoplasm of breast: Secondary | ICD-10-CM | POA: Diagnosis not present

## 2023-02-10 ENCOUNTER — Ambulatory Visit (INDEPENDENT_AMBULATORY_CARE_PROVIDER_SITE_OTHER): Payer: BC Managed Care – PPO | Admitting: Dermatology

## 2023-02-10 ENCOUNTER — Encounter: Payer: Self-pay | Admitting: Dermatology

## 2023-02-10 VITALS — BP 109/69

## 2023-02-10 DIAGNOSIS — L811 Chloasma: Secondary | ICD-10-CM

## 2023-02-10 DIAGNOSIS — L821 Other seborrheic keratosis: Secondary | ICD-10-CM | POA: Diagnosis not present

## 2023-02-10 NOTE — Progress Notes (Unsigned)
   New Patient Visit   Subjective  Terri Luna is a 57 y.o. female who presents for the following: She has discoloration on the face. She has had this for 2-3 years. She had used Prescription Hydroquinone about 6 months ago BID for 3-4 weeks. Slight improvement on upper lip. She had stopped using foundation with SPF and symptoms worsened  The following portions of the chart were reviewed this encounter and updated as appropriate: medications, allergies, medical history  Review of Systems:  No other skin or systemic complaints except as noted in HPI or Assessment and Plan.  Objective  Well appearing patient in no apparent distress; mood and affect are within normal limits.         A focused examination was performed of the following areas: Face  Relevant exam findings are noted in the Assessment and Plan.    Assessment & Plan   MELASMA Exam: reticulated hyperpigmented patches at face Hyperpigmented patches at the bilateral cheeks and upper lip  Flared  Melasma is a chronic; persistent condition of hyperpigmented patches generally on the face, worse in summer due to higher UV exposure.    Heredity; thyroid disease; sun exposure; pregnancy; birth control pills; epilepsy medication and darker skin may predispose to Melasma.   Recommendations include: - Sun avoidance and daily broad spectrum (UVA/UVB) tinted mineral sunscreen SPF 30+, with Zinc or Titanium Dioxide. - Rx topical bleaching creams (i.e. hydroquinone) is a common treatment but should not be used long term.  Hydroquinones may be mixed with retinoids; vitamin C; steroids; Kojic Acid. - Alastin A-luminate, retinoids, vitamin C, topical tranexamic acid, glycolic acid and kojic acid can be used for brightening while on break from hydroquinone - Rx Azelaic Acid is also a treatment option that is safe for pregnancy (Category B). - Chemical peels (would need multiple for best result).  - Lasers and  Microdermabrasion  may also be helpful adjunct treatments.  Treatment Plan:   Hydroquinone 12% with Niacinamide 2 x daily to be sent to [facility] Mineral sun block advised Color Science Powder with SPF to be reapplied frequently throughout the day   Dermatosis Papulosa Nigrans Exam: brown fleshy pedunculated papules  Treatment Plan: Reassured pt lesions are benign Can be removed Cosmetically   Return in about 4 months (around 06/13/2023) for Melasma Follow UP.  Terri Luna, CMA, am acting as scribe for [employer], DO.   Documentation: I have reviewed the above documentation for accuracy and completeness, and I agree with the above.  Terri Reusing, DO

## 2023-02-10 NOTE — Patient Instructions (Addendum)
Hello Terri Luna,  Thank you for visiting [place] today. We appreciate your commitment to improving your skin health. Here is a summary of the key instructions from today's consultation:  Treatment Plan for Melasma - Medication: Begin using [facility] 12% hydroquinone with niacinamide every night for three months. This product is compounded specifically for you and will be shipped directly to your address by the pharmacy.  - Sun Protection: Apply a broad-spectrum mineral sunscreen every morning. Reapply during the day using a brush-on powder sunscreen, especially when direct sun exposure is anticipated. Samples of tinted mineral sunscreen by [employer] have been provided for you to try.  - Follow-Up: We have scheduled a follow-up appointment in three months to assess the progress of your treatment. We expect significant improvement by then.  - Additional Information: Remember to limit exposure of your skincare products to air and light to prevent them from becoming discolored or degraded.  Please follow these instructions closely to achieve the best results. If you have any questions or concerns, do not hesitate to contact our office.  ~Dr. Onalee Hua       Due to recent changes in healthcare laws, you may see results of your pathology and/or laboratory studies on MyChart before the doctors have had a chance to review them. We understand that in some cases there may be results that are confusing or concerning to you. Please understand that not all results are received at the same time and often the doctors may need to interpret multiple results in order to provide you with the best plan of care or course of treatment. Therefore, we ask that you please give [place] 2 business days to thoroughly review all your results before contacting the office for clarification. Should we see a critical lab result, you will be contacted sooner.   If You Need Anything After Your Visit  If you have any questions or  concerns for your doctor, please call our main line at [phone] If no one answers, please leave a voicemail as directed and we will return your call as soon as possible. Messages left after 4 pm will be answered the following business day.   You may also send [place] a message via MyChart. We typically respond to MyChart messages within 1-2 business days.  For prescription refills, please ask your pharmacy to contact our office. Our fax number is [phone].  If you have an urgent issue when the clinic is closed that cannot wait until the next business day, you can page your doctor at the number below.    Please note that while we do our best to be available for urgent issues outside of office hours, we are not available 24/7.   If you have an urgent issue and are unable to reach [place], you may choose to seek medical care at your doctor's office, retail clinic, urgent care center, or emergency room.  If you have a medical emergency, please immediately call 911 or go to the emergency department. In the event of inclement weather, please call our main line at [phone] for an update on the status of any delays or closures.  Dermatology Medication Tips: Please keep the boxes that topical medications come in in order to help keep track of the instructions about where and how to use these. Pharmacies typically print the medication instructions only on the boxes and not directly on the medication tubes.   If your medication is too expensive, please contact our office at [phone] or send [place] a  message through MyChart.   We are unable to tell what your co-pay for medications will be in advance as this is different depending on your insurance coverage. However, we may be able to find a substitute medication at lower cost or fill out paperwork to get insurance to cover a needed medication.   If a prior authorization is required to get your medication covered by your insurance company, please allow [place]  1-2 business days to complete this process.  Drug prices often vary depending on where the prescription is filled and some pharmacies may offer cheaper prices.  The website www.goodrx.com contains coupons for medications through different pharmacies. The prices here do not account for what the cost may be with help from insurance (it may be cheaper with your insurance), but the website can give you the price if you did not use any insurance.  - You can print the associated coupon and take it with your prescription to the pharmacy.  - You may also stop by our office during regular business hours and pick up a GoodRx coupon card.  - If you need your prescription sent electronically to a different pharmacy, notify our office through [facility] or by phone at [phone]

## 2023-04-25 ENCOUNTER — Other Ambulatory Visit: Payer: Self-pay | Admitting: Family Medicine

## 2023-04-25 DIAGNOSIS — Z1159 Encounter for screening for other viral diseases: Secondary | ICD-10-CM | POA: Diagnosis not present

## 2023-04-25 DIAGNOSIS — R0789 Other chest pain: Secondary | ICD-10-CM | POA: Diagnosis not present

## 2023-04-25 DIAGNOSIS — Z0000 Encounter for general adult medical examination without abnormal findings: Secondary | ICD-10-CM | POA: Diagnosis not present

## 2023-04-25 DIAGNOSIS — E669 Obesity, unspecified: Secondary | ICD-10-CM | POA: Diagnosis not present

## 2023-04-25 DIAGNOSIS — Z23 Encounter for immunization: Secondary | ICD-10-CM | POA: Diagnosis not present

## 2023-04-25 DIAGNOSIS — Z8572 Personal history of non-Hodgkin lymphomas: Secondary | ICD-10-CM | POA: Diagnosis not present

## 2023-06-07 DIAGNOSIS — Z1239 Encounter for other screening for malignant neoplasm of breast: Secondary | ICD-10-CM | POA: Diagnosis not present

## 2023-06-07 DIAGNOSIS — Z1231 Encounter for screening mammogram for malignant neoplasm of breast: Secondary | ICD-10-CM | POA: Diagnosis not present

## 2023-06-07 DIAGNOSIS — Z01419 Encounter for gynecological examination (general) (routine) without abnormal findings: Secondary | ICD-10-CM | POA: Diagnosis not present

## 2023-06-13 ENCOUNTER — Encounter: Payer: Self-pay | Admitting: Dermatology

## 2023-06-13 ENCOUNTER — Ambulatory Visit: Payer: BC Managed Care – PPO | Admitting: Dermatology

## 2023-06-13 DIAGNOSIS — L811 Chloasma: Secondary | ICD-10-CM

## 2023-06-13 NOTE — Patient Instructions (Addendum)
Hello Terri Luna,  Thank you for visiting my office today. Your dedication to improving your skin health is greatly appreciated. Here is a summary of the key instructions from today's consultation:  - Pause Hydroquinone 12%: We have decided to take a break from using this medication. We plan to revisit its use in three months.  - Start Transaminic Acid: Please obtain this medication from [facility]. It is formulated with vitamin C, resveratrol, and niacinamide. You are instructed to apply it both in the morning and at night.  - Weekly Exfoliating Peels: You are advised to use Doctor Jim Desanctis chemical peels once a week. These can be purchased in the orange box from [place]. Please follow the application instructions provided on the box.  - Daily Skincare Routine:   - Morning: Begin by washing your face, then apply a pea-sized amount of MedRock cream, followed by sunscreen.   - Night: Wash your face, apply MedRock cream, and conclude with moisturizer.   - On weekends: Omit the MedRock cream and use the chemical peel instead.  - Sunscreen: Continue the use of Melaleuca mineral sunscreen. It is important to reapply in the afternoon, especially if you spend time outdoors.  - Follow-Up Appointments: We will schedule check-ups every three months to monitor your progress and discuss further treatments, such as microneedling for dermal melanin.  Thank you for your patience today. I hope you have a wonderful holiday season. Please ensure you leave your contact number at the front desk as requested.  Warm regards,  Dr. Langston Reusing [facility]at[facility]       Important Information  Due to recent changes in healthcare laws, you may see results of your pathology and/or laboratory studies on MyChart before the doctors have had a chance to review them. We understand that in some cases there may be results that are confusing or concerning to you. Please understand that not all results are received  at the same time and often the doctors may need to interpret multiple results in order to provide you with the best plan of care or course of treatment. Therefore, we ask that you please give [place] 2 business days to thoroughly review all your results before contacting the office for clarification. Should we see a critical lab result, you will be contacted sooner.   If You Need Anything After Your Visit  If you have any questions or concerns for your doctor, please call our main line at [phone] If no one answers, please leave a voicemail as directed and we will return your call as soon as possible. Messages left after 4 pm will be answered the following business day.   You may also send [place] a message via MyChart. We typically respond to MyChart messages within 1-2 business days.  For prescription refills, please ask your pharmacy to contact our office. Our fax number is [phone].  If you have an urgent issue when the clinic is closed that cannot wait until the next business day, you can page your doctor at the number below.    Please note that while we do our best to be available for urgent issues outside of office hours, we are not available 24/7.   If you have an urgent issue and are unable to reach [place], you may choose to seek medical care at your doctor's office, retail clinic, urgent care center, or emergency room.  If you have a medical emergency, please immediately call 911 or go to the emergency department. In the event of  inclement weather, please call our main line at [phone] for an update on the status of any delays or closures.  Dermatology Medication Tips: Please keep the boxes that topical medications come in in order to help keep track of the instructions about where and how to use these. Pharmacies typically print the medication instructions only on the boxes and not directly on the medication tubes.   If your medication is too expensive, please contact our office at  [phone] or send [place] a message through MyChart.   We are unable to tell what your co-pay for medications will be in advance as this is different depending on your insurance coverage. However, we may be able to find a substitute medication at lower cost or fill out paperwork to get insurance to cover a needed medication.   If a prior authorization is required to get your medication covered by your insurance company, please allow [place] 1-2 business days to complete this process.  Drug prices often vary depending on where the prescription is filled and some pharmacies may offer cheaper prices.  The website www.goodrx.com contains coupons for medications through different pharmacies. The prices here do not account for what the cost may be with help from insurance (it may be cheaper with your insurance), but the website can give you the price if you did not use any insurance.  - You can print the associated coupon and take it with your prescription to the pharmacy.  - You may also stop by our office during regular business hours and pick up a GoodRx coupon card.  - If you need your prescription sent electronically to a different pharmacy, notify our office through [facility] or by phone at [phone]

## 2023-06-13 NOTE — Progress Notes (Signed)
   Follow-Up Visit   Subjective  Terri Luna is a 57 y.o. female who presents for the following: Melasma  Patient present today for follow up visit for follow up. Patient was last evaluated on 02/10/23. Patient reports sxs are better. She is still using the [facility] lightening compound but she mentioned that it is not clearing as fast as she would like. Patient denies medication changes.  The following portions of the chart were reviewed this encounter and updated as appropriate: medications, allergies, medical history  Review of Systems:  No other skin or systemic complaints except as noted in HPI or Assessment and Plan.  Objective  Well appearing patient in no apparent distress; mood and affect are within normal limits.   A focused examination was performed of the following areas:   Relevant exam findings are noted in the Assessment and Plan.          Assessment & Plan   1. Melasma (Improved) - Assessment: Improvement noted with hydroquinone 12% treatment; lighter pigmentation observed. Both epidermal and dermal pigmentation identified, with dermal pigment posing more treatment challenges. - Plan: Stop hydroquinone 12% use for 3 months. Initiate treatment with transaminic acid cream (containing vitamin C, resveratrol, and niacinamide) from [facility], to be applied twice daily. Employ Doctor Jim Desanctis exfoliating chemical peels (orange version) once weekly, applying Aquaphor around lips and eyes beforehand to prevent irritation. Continue application of mineral sunscreen (Melaleuca brand), with reapplication in the afternoon if outdoors. Schedule follow-up in 3 months, with consideration of microneedling for dermal melanin at that time.  2. Skincare Regimen - Assessment: Patient utilizes Rubie Maid moisturizer and adheres to a prescribed skincare routine. - Plan: Morning routine to include face washing, application of a pea-sized amount of MedRock cream, followed  by sunscreen. Evening routine to consist of face washing, application of MedRock cream, and then moisturizer. On weekends (Saturday or Sunday), omit prescription cream and use chemical peel instead. Continue use of Rubie Maid moisturizer.   No follow-ups on file.    Documentation: I have reviewed the above documentation for accuracy and completeness, and I agree with the above.  I, Shirron Marcha Solders, CMA, am acting as scribe for [employer], DO.   Langston Reusing, DO

## 2023-06-27 ENCOUNTER — Other Ambulatory Visit: Payer: Self-pay

## 2023-06-27 DIAGNOSIS — L811 Chloasma: Secondary | ICD-10-CM

## 2023-06-27 MED ORDER — SAFETY SEAL MISCELLANEOUS MISC: 1.0000 | Freq: Every evening | 5 refills | Status: AC

## 2023-09-13 ENCOUNTER — Ambulatory Visit: Payer: BC Managed Care – PPO | Admitting: Dermatology

## 2023-09-28 ENCOUNTER — Ambulatory Visit: Payer: BC Managed Care – PPO | Admitting: Dermatology

## 2023-10-17 ENCOUNTER — Ambulatory Visit (INDEPENDENT_AMBULATORY_CARE_PROVIDER_SITE_OTHER): Admitting: Dermatology

## 2023-10-17 ENCOUNTER — Encounter: Payer: Self-pay | Admitting: Dermatology

## 2023-10-17 VITALS — BP 117/76 | HR 93

## 2023-10-17 DIAGNOSIS — L811 Chloasma: Secondary | ICD-10-CM

## 2023-10-17 MED ORDER — SAFETY SEAL MISCELLANEOUS MISC: 1.0000 | Freq: Every evening | 4 refills | Status: AC

## 2023-10-17 NOTE — Patient Instructions (Addendum)
 Hello  Terri Luna,  Thank you for visiting today. Here is a summary of the key instructions:  - Medications:   - Use hydroquinone 12% with kojic acid from Medrock at nighttime only   - Apply Eucerin Radiant Tone with thiamidol twice a day, morning and night   - Use hydroquinone and Eucerin Radiant Tone together at night  - Skincare:   - Use a physical sunscreen with titanium dioxide or zinc oxide   - Try Neutrogena Daily Mineral Invisible Defense sunscreen   - Reapply sunscreen often, especially when spending time in the sun   - Consider using a CeraVe zinc invisible sunscreen stick on melasma patches on top of regular sunscreen  - Lifestyle Changes:   - Avoid direct sun exposure, including sitting in front of windows   - Be extra careful with sun protection on weekends and during sunny weather  - Optional:   - Use Jim Desanctis pads twice a week for exfoliation if desired  Please reach out if you have any questions or concerns.  Warm regards,  Dr. [facility]      Important Information   Due to recent changes in healthcare laws, you may see results of your pathology and/or laboratory studies on MyChart before the doctors have had a chance to review them. We understand that in some cases there may be results that are confusing or concerning to you. Please understand that not all results are received at the same time and often the doctors may need to interpret multiple results in order to provide you with the best plan of care or course of treatment. Therefore, we ask that you please give [place] 2 business days to thoroughly review all your results before contacting the office for clarification. Should we see a critical lab result, you will be contacted sooner.     If You Need Anything After Your Visit   If you have any questions or concerns for your doctor, please call our main line at [phone]. If no one answers, please leave a voicemail as directed and we will  return your call as soon as possible. Messages left after 4 pm will be answered the following business day.    You may also send [place] a message via MyChart. We typically respond to MyChart messages within 1-2 business days.  For prescription refills, please ask your pharmacy to contact our office. Our fax number is [phone].  If you have an urgent issue when the clinic is closed that cannot wait until the next business day, you can page your doctor at the number below.     Please note that while we do our best to be available for urgent issues outside of office hours, we are not available 24/7.    If you have an urgent issue and are unable to reach [place], you may choose to seek medical care at your doctor's office, retail clinic, urgent care center, or emergency room.   If you have a medical emergency, please immediately call 911 or go to the emergency department. In the event of inclement weather, please call our main line at [phone] for an update on the status of any delays or closures.  Dermatology Medication Tips: Please keep the boxes that topical medications come in in order to help keep track of the instructions about where and how to use these. Pharmacies typically print the medication instructions only on the boxes and not directly on the medication tubes.   If your medication is  too expensive, please contact our office at [phone] or send [place] a message through MyChart.    We are unable to tell what your co-pay for medications will be in advance as this is different depending on your insurance coverage. However, we may be able to find a substitute medication at lower cost or fill out paperwork to get insurance to cover a needed medication.    If a prior authorization is required to get your medication covered by your insurance company, please allow [place] 1-2 business days to complete this process.   Drug prices often vary depending on where the prescription is filled and some  pharmacies may offer cheaper prices.   The website www.goodrx.com contains coupons for medications through different pharmacies. The prices here do not account for what the cost may be with help from insurance (it may be cheaper with your insurance), but the website can give you the price if you did not use any insurance.  - You can print the associated coupon and take it with your prescription to the pharmacy.  - You may also stop by our office during regular business hours and pick up a GoodRx coupon card.  - If you need your prescription sent electronically to a different pharmacy, notify our office through [facility] or by phone at [phone]

## 2023-10-17 NOTE — Progress Notes (Signed)
   Follow-Up Visit   Subjective  Terri Luna is a 58 y.o. female who presents for the following: Melasma  Patient present today for follow up visit for Melasma. Patient was last evaluated on 06/27/23. At this visit patient was prescribed Cache' Anti Aging Cream, Dr. Jim Desanctis Exfoliating pads & daily sunscreen usage. Patient reports sxs are worse. She states she feels the Hydroquinone worked better for her. Patient denies medication changes.  The following portions of the chart were reviewed this encounter and updated as appropriate: medications, allergies, medical history  Review of Systems:  No other skin or systemic complaints except as noted in HPI or Assessment and Plan.  Objective  Well appearing patient in no apparent distress; mood and affect are within normal limits.  A focused examination was performed of the following areas: Face  Relevant exam findings are noted in the Assessment and Plan.           Assessment & Plan   MELASMA Exam: Reticulated hyperpigmented patches at face  Not at goal  Melasma is a chronic; persistent condition of hyperpigmented patches generally on the face, worse in summer due to higher UV exposure.    Heredity; thyroid disease; sun exposure; pregnancy; birth control pills; epilepsy medication and darker skin may predispose to Melasma.   Melasma - Assessment: Patient's melasma has worsened since the last visit, though not as severe as initial presentation. Previous hydroquinone 12% treatment was effective but discontinued. Recent Medrol cream less efficacious. Sun exposure without adequate protection contributed to loss of progress. Condition highly sensitive to sun exposure, with rapid recurrence possible after brief periods of inadequate sun protection.  - Plan:    Restart hydroquinone 12% with kojic acid from [employer], applied nightly    Initiate Eucerin Radiant Tone (containing thiamidol) twice daily, morning and night    Use  physical sunscreen with titanium dioxide or zinc oxide (e.g., Neutrogena Daily Mineral Invisible Defense)    Consider additional sun protection with CeraVe zinc invisible sunscreen stick on melasma patches    Continue Jim Desanctis pads twice weekly for exfoliation, if desired    Patient educated on importance of consistent sun protection and frequent reapplication    Samples provided for new treatments  MELASMA   Related Medications Safety Seal Miscellaneous MISC Apply 1 Application topically at bedtime. Medication Name: Delight Stare Anti Aging Cream  Return in about 4 months (around 02/16/2024) for Melasma F/U.  Documentation: I have reviewed the above documentation for accuracy and completeness, and I agree with the above.  Stasia Cavalier, am acting as scribe for Langston Reusing, DO.  Langston Reusing, DO

## 2024-01-09 DIAGNOSIS — M25572 Pain in left ankle and joints of left foot: Secondary | ICD-10-CM | POA: Insufficient documentation

## 2024-01-09 DIAGNOSIS — M722 Plantar fascial fibromatosis: Secondary | ICD-10-CM | POA: Insufficient documentation

## 2024-01-23 ENCOUNTER — Other Ambulatory Visit: Payer: Self-pay | Admitting: Orthopedic Surgery

## 2024-01-23 DIAGNOSIS — D1724 Benign lipomatous neoplasm of skin and subcutaneous tissue of left leg: Secondary | ICD-10-CM | POA: Insufficient documentation

## 2024-01-26 ENCOUNTER — Encounter (HOSPITAL_BASED_OUTPATIENT_CLINIC_OR_DEPARTMENT_OTHER): Payer: Self-pay | Admitting: Orthopedic Surgery

## 2024-02-01 NOTE — H&P (Cosign Needed)
  Chief Complaint: Painful left hindfoot mass HPI: Patient is a 58 year old female that presents to the OR today for definitive treatment for painful left hindfoot mass.  The mass has become larger and more symptomatic to the point where footwear is difficult.  She has failed conservative treatment and elects for surgical intervention.  Allergies: No Active Allergies  Past Medical History:  Diagnosis Date   Lymphoma (HCC)    Obesity     Past Surgical History:  Procedure Laterality Date   LAPAROSCOPIC GASTRIC SLEEVE RESECTION     2016   PORT-A-CATH REMOVAL  08/12/2011   Procedure: REMOVAL PORT-A-CATH;  Surgeon: Dorise MARLA Fellers, MD;  Location: [facility] OR;  Service: Thoracic;  Laterality: Right;   PORTACATH PLACEMENT      Family History: Family History  Problem Relation Age of Onset   Lung cancer Father    Hypertension Mother    Anemia Mother    Diabetes Maternal Grandmother     Social History:   reports that she has never smoked. She has never been exposed to tobacco smoke. She has never used smokeless tobacco. She reports current alcohol use. She reports that she does not use drugs.  Medications: No medications prior to admission.    No results found for this or any previous visit (from the past 48 hours).  No results found.    Height 5' 2.5 (1.588 m), weight 93 kg.  PE:  well nourished and well developed.  NAD.  EOMI.  Resp unlabored.  Tender to palpation over posterior lateral left hindfoot mass.  Assessment/Plan Left hindfoot mass  The patient presents to the OR for definitive treatment for her painful left hindfoot mass.  She will require excision of the hindfoot mass, that will be sent for biopsy.  After reviewing the procedure, postoperative protocol, and risks of surgery the patient elects for surgical intervention.  The patient specifically understands risks of bleeding, infection, nerve damage, blood clots, need for additional surgery, continued pain, nonunion,  post traumatic arthritis, recurrence of deformity, amputation and death.  Eva Barrack PA-C [facility] Office:  [phone]   The risks and benefits of the alternative treatment options have been discussed in detail.  The patient wishes to proceed with surgery and specifically understands risks of bleeding, infection, nerve damage, blood clots, need for additional surgery, amputation and death.

## 2024-02-02 ENCOUNTER — Other Ambulatory Visit: Payer: Self-pay

## 2024-02-02 ENCOUNTER — Ambulatory Visit (HOSPITAL_BASED_OUTPATIENT_CLINIC_OR_DEPARTMENT_OTHER): Payer: Self-pay | Admitting: Anesthesiology

## 2024-02-02 ENCOUNTER — Ambulatory Visit (HOSPITAL_BASED_OUTPATIENT_CLINIC_OR_DEPARTMENT_OTHER)
Admission: RE | Admit: 2024-02-02 | Discharge: 2024-02-02 | Disposition: A | Discharge status: Home / Self Care | Attending: Orthopedic Surgery | Admitting: Orthopedic Surgery

## 2024-02-02 ENCOUNTER — Encounter (HOSPITAL_BASED_OUTPATIENT_CLINIC_OR_DEPARTMENT_OTHER): Payer: Self-pay | Admitting: Orthopedic Surgery

## 2024-02-02 ENCOUNTER — Encounter (HOSPITAL_BASED_OUTPATIENT_CLINIC_OR_DEPARTMENT_OTHER)
Admission: RE | Disposition: A | Discharge status: Home / Self Care | Payer: Self-pay | Source: Home / Self Care | Attending: Orthopedic Surgery

## 2024-02-02 DIAGNOSIS — D1724 Benign lipomatous neoplasm of skin and subcutaneous tissue of left leg: Secondary | ICD-10-CM | POA: Diagnosis present

## 2024-02-02 DIAGNOSIS — Z9884 Bariatric surgery status: Secondary | ICD-10-CM | POA: Diagnosis not present

## 2024-02-02 DIAGNOSIS — R2242 Localized swelling, mass and lump, left lower limb: Secondary | ICD-10-CM | POA: Diagnosis not present

## 2024-02-02 DIAGNOSIS — E669 Obesity, unspecified: Secondary | ICD-10-CM | POA: Diagnosis not present

## 2024-02-02 DIAGNOSIS — Z01818 Encounter for other preprocedural examination: Secondary | ICD-10-CM

## 2024-02-02 DIAGNOSIS — Z6837 Body mass index (BMI) 37.0-37.9, adult: Secondary | ICD-10-CM | POA: Insufficient documentation

## 2024-02-02 HISTORY — PX: EXCISION MASS LOWER EXTREMETIES: SHX6705

## 2024-02-02 SURGERY — EXCISION MASS LOWER EXTREMITIES
Anesthesia: Regional | Site: Foot | Laterality: Left

## 2024-02-02 MED ORDER — PROPOFOL 10 MG/ML IV BOLUS
INTRAVENOUS | Status: DC | PRN
  Administered 2024-02-02: 130 mg via INTRAVENOUS

## 2024-02-02 MED ORDER — PROPOFOL 10 MG/ML IV BOLUS
INTRAVENOUS | Status: AC
  Filled 2024-02-02: qty 20

## 2024-02-02 MED ORDER — DEXAMETHASONE SODIUM PHOSPHATE 10 MG/ML IJ SOLN
INTRAMUSCULAR | Status: DC | PRN
  Administered 2024-02-02: 10 mg via INTRAVENOUS

## 2024-02-02 MED ORDER — LIDOCAINE 2% (20 MG/ML) 5 ML SYRINGE
INTRAMUSCULAR | Status: AC
  Filled 2024-02-02: qty 5

## 2024-02-02 MED ORDER — ACETAMINOPHEN 500 MG PO TABS
1000.0000 mg | ORAL_TABLET | Freq: Once | ORAL | Status: AC
  Administered 2024-02-02: 1000 mg via ORAL

## 2024-02-02 MED ORDER — FENTANYL CITRATE (PF) 100 MCG/2ML IJ SOLN
100.0000 ug | Freq: Once | INTRAMUSCULAR | Status: AC
  Administered 2024-02-02: 100 ug via INTRAVENOUS

## 2024-02-02 MED ORDER — DOCUSATE SODIUM 100 MG PO CAPS: 100.0000 mg | ORAL_CAPSULE | Freq: Two times a day (BID) | ORAL | 0 refills | Status: DC

## 2024-02-02 MED ORDER — AMISULPRIDE (ANTIEMETIC) 5 MG/2ML IV SOLN: 10.0000 mg | Freq: Once | INTRAVENOUS | Status: DC | PRN

## 2024-02-02 MED ORDER — ONDANSETRON HCL 4 MG/2ML IJ SOLN
INTRAMUSCULAR | Status: DC | PRN
  Administered 2024-02-02: 4 mg via INTRAVENOUS

## 2024-02-02 MED ORDER — SUGAMMADEX SODIUM 200 MG/2ML IV SOLN
INTRAVENOUS | Status: AC
  Filled 2024-02-02: qty 2

## 2024-02-02 MED ORDER — OXYCODONE HCL 5 MG PO TABS: 5.0000 mg | ORAL_TABLET | Freq: Once | ORAL | Status: DC | PRN

## 2024-02-02 MED ORDER — CEFAZOLIN SODIUM-DEXTROSE 2-4 GM/100ML-% IV SOLN
INTRAVENOUS | Status: AC
  Filled 2024-02-02: qty 100

## 2024-02-02 MED ORDER — HYDROMORPHONE HCL 1 MG/ML IJ SOLN: 0.2500 mg | INTRAMUSCULAR | Status: DC | PRN

## 2024-02-02 MED ORDER — BUPIVACAINE-EPINEPHRINE (PF) 0.5% -1:200000 IJ SOLN
INTRAMUSCULAR | Status: AC
  Filled 2024-02-02: qty 30

## 2024-02-02 MED ORDER — HYDROCODONE-ACETAMINOPHEN 5-325 MG PO TABS: 1.0000 | ORAL_TABLET | Freq: Four times a day (QID) | ORAL | 0 refills | Status: AC | PRN

## 2024-02-02 MED ORDER — MIDAZOLAM HCL 2 MG/2ML IJ SOLN
INTRAMUSCULAR | Status: AC
  Filled 2024-02-02: qty 2

## 2024-02-02 MED ORDER — ROCURONIUM BROMIDE 10 MG/ML (PF) SYRINGE
PREFILLED_SYRINGE | INTRAVENOUS | Status: AC
  Filled 2024-02-02: qty 10

## 2024-02-02 MED ORDER — MIDAZOLAM HCL 2 MG/2ML IJ SOLN
2.0000 mg | Freq: Once | INTRAMUSCULAR | Status: AC
  Administered 2024-02-02: 2 mg via INTRAVENOUS

## 2024-02-02 MED ORDER — LIDOCAINE HCL (CARDIAC) PF 100 MG/5ML IV SOSY
PREFILLED_SYRINGE | INTRAVENOUS | Status: DC | PRN
  Administered 2024-02-02: 40 mg via INTRAVENOUS

## 2024-02-02 MED ORDER — ROCURONIUM BROMIDE 100 MG/10ML IV SOLN
INTRAVENOUS | Status: DC | PRN
  Administered 2024-02-02: 70 mg via INTRAVENOUS

## 2024-02-02 MED ORDER — PHENYLEPHRINE 80 MCG/ML (10ML) SYRINGE FOR IV PUSH (FOR BLOOD PRESSURE SUPPORT)
PREFILLED_SYRINGE | INTRAVENOUS | Status: AC
  Filled 2024-02-02: qty 10

## 2024-02-02 MED ORDER — FENTANYL CITRATE (PF) 100 MCG/2ML IJ SOLN
INTRAMUSCULAR | Status: AC
  Filled 2024-02-02: qty 2

## 2024-02-02 MED ORDER — FENTANYL CITRATE (PF) 100 MCG/2ML IJ SOLN
INTRAMUSCULAR | Status: DC | PRN
  Administered 2024-02-02 (×2): 50 ug via INTRAVENOUS

## 2024-02-02 MED ORDER — SENNA 8.6 MG PO TABS
2.0000 | ORAL_TABLET | Freq: Two times a day (BID) | ORAL | 0 refills | Status: DC
Start: 2024-02-02 — End: 2024-02-27

## 2024-02-02 MED ORDER — PHENYLEPHRINE HCL (PRESSORS) 10 MG/ML IV SOLN
INTRAVENOUS | Status: DC | PRN
  Administered 2024-02-02: 80 ug via INTRAVENOUS

## 2024-02-02 MED ORDER — DEXAMETHASONE SODIUM PHOSPHATE 10 MG/ML IJ SOLN
INTRAMUSCULAR | Status: AC
  Filled 2024-02-02: qty 1

## 2024-02-02 MED ORDER — DEXAMETHASONE SODIUM PHOSPHATE 10 MG/ML IJ SOLN
INTRAMUSCULAR | Status: DC | PRN
  Administered 2024-02-02: 10 mg

## 2024-02-02 MED ORDER — LACTATED RINGERS IV SOLN: INTRAVENOUS | Status: DC

## 2024-02-02 MED ORDER — MEPERIDINE HCL 25 MG/ML IJ SOLN: 6.2500 mg | INTRAMUSCULAR | Status: DC | PRN

## 2024-02-02 MED ORDER — ACETAMINOPHEN 500 MG PO TABS
ORAL_TABLET | ORAL | Status: AC
  Filled 2024-02-02: qty 2

## 2024-02-02 MED ORDER — ONDANSETRON HCL 4 MG/2ML IJ SOLN
INTRAMUSCULAR | Status: AC
  Filled 2024-02-02: qty 2

## 2024-02-02 MED ORDER — VANCOMYCIN HCL 500 MG IV SOLR
INTRAVENOUS | Status: DC | PRN
  Administered 2024-02-02: 500 mg via TOPICAL

## 2024-02-02 MED ORDER — SUGAMMADEX SODIUM 200 MG/2ML IV SOLN
INTRAVENOUS | Status: DC | PRN
Start: 2024-02-02 — End: 2024-02-02
  Administered 2024-02-02: 200 mg via INTRAVENOUS

## 2024-02-02 MED ORDER — KETOROLAC TROMETHAMINE 30 MG/ML IJ SOLN: 30.0000 mg | Freq: Once | INTRAMUSCULAR | Status: DC | PRN

## 2024-02-02 MED ORDER — OXYCODONE HCL 5 MG/5ML PO SOLN: 5.0000 mg | Freq: Once | ORAL | Status: DC | PRN

## 2024-02-02 MED ORDER — 0.9 % SODIUM CHLORIDE (POUR BTL) OPTIME
TOPICAL | Status: DC | PRN
  Administered 2024-02-02: 200 mL

## 2024-02-02 MED ORDER — ROPIVACAINE HCL 5 MG/ML IJ SOLN
INTRAMUSCULAR | Status: DC | PRN
  Administered 2024-02-02: 40 mL via PERINEURAL

## 2024-02-02 MED ORDER — ONDANSETRON HCL 4 MG/2ML IJ SOLN: 4.0000 mg | Freq: Once | INTRAMUSCULAR | Status: DC | PRN

## 2024-02-02 MED ORDER — CEFAZOLIN SODIUM-DEXTROSE 2-4 GM/100ML-% IV SOLN
2.0000 g | INTRAVENOUS | Status: AC
  Administered 2024-02-02: 2 g via INTRAVENOUS

## 2024-02-02 MED ORDER — VANCOMYCIN HCL 500 MG IV SOLR
INTRAVENOUS | Status: AC
Start: 2024-02-02 — End: 2024-02-02
  Filled 2024-02-02: qty 10

## 2024-02-02 SURGICAL SUPPLY — 56 items
BLADE MINI RND TIP GREEN BEAV (BLADE) IMPLANT
BLADE SURG 15 STRL LF DISP TIS (BLADE) ×1 IMPLANT
BNDG COMPR ESMARK 4X3 LF (GAUZE/BANDAGES/DRESSINGS) IMPLANT
BNDG COMPR ESMARK 6X3 LF (GAUZE/BANDAGES/DRESSINGS) IMPLANT
BNDG ELASTIC 4INX 5YD STR LF (GAUZE/BANDAGES/DRESSINGS) IMPLANT
BNDG ELASTIC 6INX 5YD STR LF (GAUZE/BANDAGES/DRESSINGS) IMPLANT
BNDG STRETCH GAUZE 3IN X12FT (GAUZE/BANDAGES/DRESSINGS) IMPLANT
BOOT STEPPER DURA SM (SOFTGOODS) IMPLANT
CHLORAPREP W/TINT 26 (MISCELLANEOUS) ×1 IMPLANT
CORD BIPOLAR FORCEPS 12FT (ELECTRODE) IMPLANT
COVER BACK TABLE 60X90IN (DRAPES) ×1 IMPLANT
CUFF TOURN SGL QUICK 42 (TOURNIQUET CUFF) ×1 IMPLANT
CUFF TRNQT CYL 24X4X16.5-23 (TOURNIQUET CUFF) IMPLANT
CUFF TRNQT CYL 34X4.125X (TOURNIQUET CUFF) IMPLANT
DRAPE EXTREMITY T 121X128X90 (DISPOSABLE) ×1 IMPLANT
DRAPE OEC MINIVIEW 54X84 (DRAPES) IMPLANT
DRAPE SURG 17X23 STRL (DRAPES) IMPLANT
DRAPE U-SHAPE 47X51 STRL (DRAPES) IMPLANT
DRSG MEPITEL 4X7.2 (GAUZE/BANDAGES/DRESSINGS) ×1 IMPLANT
ELECTRODE REM PT RTRN 9FT ADLT (ELECTROSURGICAL) ×1 IMPLANT
GAUZE PAD ABD 8X10 STRL (GAUZE/BANDAGES/DRESSINGS) ×1 IMPLANT
GAUZE SPONGE 4X4 12PLY STRL (GAUZE/BANDAGES/DRESSINGS) ×1 IMPLANT
GLOVE BIO SURGEON STRL SZ 6.5 (GLOVE) IMPLANT
GLOVE BIO SURGEON STRL SZ8 (GLOVE) ×1 IMPLANT
GLOVE BIOGEL PI IND STRL 7.0 (GLOVE) IMPLANT
GLOVE BIOGEL PI IND STRL 8 (GLOVE) ×2 IMPLANT
GLOVE ECLIPSE 8.0 STRL XLNG CF (GLOVE) ×1 IMPLANT
GOWN STRL REUS W/ TWL LRG LVL3 (GOWN DISPOSABLE) ×1 IMPLANT
GOWN STRL REUS W/ TWL XL LVL3 (GOWN DISPOSABLE) ×2 IMPLANT
NDL HYPO 22X1.5 SAFETY MO (MISCELLANEOUS) IMPLANT
NDL HYPO 25X1 1.5 SAFETY (NEEDLE) IMPLANT
NEEDLE HYPO 22X1.5 SAFETY MO (MISCELLANEOUS) IMPLANT
NEEDLE HYPO 25X1 1.5 SAFETY (NEEDLE) IMPLANT
NS IRRIG 1000ML POUR BTL (IV SOLUTION) ×1 IMPLANT
PACK BASIN DAY SURGERY FS (CUSTOM PROCEDURE TRAY) ×1 IMPLANT
PAD CAST 4YDX4 CTTN HI CHSV (CAST SUPPLIES) ×1 IMPLANT
PADDING CAST ABS COTTON 4X4 ST (CAST SUPPLIES) IMPLANT
PADDING CAST COTTON 6X4 STRL (CAST SUPPLIES) IMPLANT
PENCIL SMOKE EVACUATOR (MISCELLANEOUS) ×1 IMPLANT
SANITIZER HAND ALTRA PUMP 550 (MISCELLANEOUS) ×1 IMPLANT
SHEET MEDIUM DRAPE 40X70 STRL (DRAPES) ×1 IMPLANT
SLEEVE SCD COMPRESS KNEE MED (STOCKING) ×1 IMPLANT
SPONGE T-LAP 18X18 ~~LOC~~+RFID (SPONGE) ×1 IMPLANT
STOCKINETTE 6 STRL (DRAPES) ×1 IMPLANT
STRIP CLOSURE SKIN 1/2X4 (GAUZE/BANDAGES/DRESSINGS) IMPLANT
SUCTION TUBE FRAZIER 10FR DISP (SUCTIONS) IMPLANT
SUT ETHILON 3 0 PS 1 (SUTURE) ×1 IMPLANT
SUT MNCRL AB 3-0 PS2 18 (SUTURE) IMPLANT
SUT VIC AB 2-0 SH 27XBRD (SUTURE) IMPLANT
SUT VICRYL 0 SH 27 (SUTURE) IMPLANT
SYR BULB EAR ULCER 3OZ GRN STR (SYRINGE) ×1 IMPLANT
SYR CONTROL 10ML LL (SYRINGE) IMPLANT
TOWEL GREEN STERILE FF (TOWEL DISPOSABLE) ×1 IMPLANT
TUBE CONNECTING 20X1/4 (TUBING) IMPLANT
UNDERPAD 30X36 HEAVY ABSORB (UNDERPADS AND DIAPERS) ×1 IMPLANT
YANKAUER SUCT BULB TIP NO VENT (SUCTIONS) IMPLANT

## 2024-02-02 NOTE — Anesthesia Procedure Notes (Signed)
 Procedure Name: Intubation Date/Time: 02/02/2024 7:44 AM  Performed by: Frost Kayla MATSU, CRNAPre-anesthesia Checklist: Patient identified, Emergency Drugs available, Suction available and Patient being monitored Patient Re-evaluated:Patient Re-evaluated prior to induction Oxygen Delivery Method: Circle system utilized Preoxygenation: Pre-oxygenation with 100% oxygen Induction Type: IV induction Ventilation: Mask ventilation without difficulty Laryngoscope Size: Miller and 3 Grade View: Grade I Tube type: Oral Tube size: 7.0 mm Number of attempts: 1 Airway Equipment and Method: Stylet and Oral airway Placement Confirmation: ETT inserted through vocal cords under direct vision, positive ETCO2 and breath sounds checked- equal and bilateral Secured at: 20 cm Tube secured with: Tape Dental Injury: Teeth and Oropharynx as per pre-operative assessment

## 2024-02-02 NOTE — Anesthesia Procedure Notes (Signed)
 Anesthesia Regional Block: Adductor canal block   Pre-Anesthetic Checklist: , timeout performed,  Correct Patient, Correct Site, Correct Laterality,  Correct Procedure, Correct Position, site marked,  Risks and benefits discussed,  Surgical consent,  Pre-op evaluation,  At surgeon's request and post-op pain management  Laterality: Left  Prep: Maximum Sterile Barrier Precautions used, chloraprep       Needles:  Injection technique: Single-shot  Needle Type: Echogenic Stimulator Needle     Needle Length: 9cm  Needle Gauge: 22     Additional Needles:   Procedures:,,,, ultrasound used (permanent image in chart),,    Narrative:  Start time: 02/02/2024 6:55 AM End time: 02/02/2024 7:00 AM Injection made incrementally with aspirations every 5 mL.  Performed by: Personally  Anesthesiologist: Merla Almarie HERO, DO  Additional Notes: Monitors applied. No increased pain on injection. No increased resistance to injection. Injection made in 5cc increments. Good needle visualization. Patient tolerated procedure well.

## 2024-02-02 NOTE — Discharge Instructions (Addendum)
 Norleen Armor, MD [facility]  Please read the following information regarding your care after surgery.  Medications  You only need a prescription for the narcotic pain medicine (ex. oxycodone , Percocet, Norco).  All of the other medicines listed below are available over the counter. ? Aleve 2 pills twice a day for the first 3 days after surgery. ? hydrocodone  as prescribed for severe pain  Narcotic pain medicine (ex. oxycodone , Percocet, Vicodin) will cause constipation.  To prevent this problem, take the following medicines while you are taking any pain medicine. ? docusate sodium  (Colace) 100 mg twice a day ? senna (Senokot) 2 tablets twice a day   Weight Bearing ? Bear weight only on your operated foot in the CAM boot.   Cast / Splint / Dressing ? Keep your splint, cast or dressing clean and dry.  Don't put anything (coat hanger, pencil, etc) down inside of it.  If it gets damp, use a hair dryer on the cool setting to dry it.  If it gets soaked, call the office to schedule an appointment for a cast change.   After your dressing, cast or splint is removed; you may shower, but do not soak or scrub the wound.  Allow the water to run over it, and then gently pat it dry.  Swelling It is normal for you to have swelling where you had surgery.  To reduce swelling and pain, keep your toes above your nose for at least 3 days after surgery.  It may be necessary to keep your foot or leg elevated for several weeks.  If it hurts, it should be elevated.  Follow Up Call my office at [phone] when you are discharged from the hospital or surgery center to schedule an appointment to be seen two weeks after surgery.  Call my office at [phone] if you develop a fever >101.5 F, nausea, vomiting, bleeding from the surgical site or severe pain.       Post Anesthesia Home Care Instructions  Activity: Get plenty of rest for the remainder of the day. A responsible individual must stay with you  for 24 hours following the procedure.  For the next 24 hours, DO NOT: -Drive a car -Advertising copywriter -Drink alcoholic beverages -Take any medication unless instructed by your physician -Make any legal decisions or sign important papers.  Meals: Start with liquid foods such as gelatin or soup. Progress to regular foods as tolerated. Avoid greasy, spicy, heavy foods. If nausea and/or vomiting occur, drink only clear liquids until the nausea and/or vomiting subsides. Call your physician if vomiting continues.  Special Instructions/Symptoms: Your throat may feel dry or sore from the anesthesia or the breathing tube placed in your throat during surgery. If this causes discomfort, gargle with warm salt water. The discomfort should disappear within 24 hours.  If you had a scopolamine patch placed behind your ear for the management of post- operative nausea and/or vomiting:  1. The medication in the patch is effective for 72 hours, after which it should be removed.  Wrap patch in a tissue and discard in the trash. Wash hands thoroughly with soap and water. 2. You may remove the patch earlier than 72 hours if you experience unpleasant side effects which may include dry mouth, dizziness or visual disturbances. 3. Avoid touching the patch. Wash your hands with soap and water after contact with the patch.   Regional Anesthesia Blocks  1. You may not be able to move or feel the blocked extremity after a  regional anesthetic block. This may last may last from 3-48 hours after placement, but it will go away. The length of time depends on the medication injected and your individual response to the medication. As the nerves start to wake up, you may experience tingling as the movement and feeling returns to your extremity. If the numbness and inability to move your extremity has not gone away after 48 hours, please call your surgeon.   2. The extremity that is blocked will need to be protected until the  numbness is gone and the strength has returned. Because you cannot feel it, you will need to take extra care to avoid injury. Because it may be weak, you may have difficulty moving it or using it. You may not know what position it is in without looking at it while the block is in effect.  3. For blocks in the legs and feet, returning to weight bearing and walking needs to be done carefully. You will need to wait until the numbness is entirely gone and the strength has returned. You should be able to move your leg and foot normally before you try and bear weight or walk. You will need someone to be with you when you first try to ensure you do not fall and possibly risk injury.  4. Bruising and tenderness at the needle site are common side effects and will resolve in a few days.  5. Persistent numbness or new problems with movement should be communicated to the surgeon or the [facility] [phone] [facility] [phone]).   No tylenol  until 12:30pm

## 2024-02-02 NOTE — Op Note (Signed)
 02/02/2024  8:28 AM  PATIENT:  Terri Luna  58 y.o. female  PRE-OPERATIVE DIAGNOSIS: Left ankle mass  POST-OPERATIVE DIAGNOSIS: Same (3 cm x 2 cm x 2 cm)  Procedure(s): Excision left ankle subfascial mass measuring 3 cm x 2 cm x 2 cm  SURGEON:  Norleen Armor, MD  ASSISTANT: Eva Barrack, PA-C  ANESTHESIA:   General, regional  EBL:  minimal   TOURNIQUET:   Total Tourniquet Time Documented: Thigh (Left) - 17 minutes Total: Thigh (Left) - 17 minutes  COMPLICATIONS:  None apparent  DISPOSITION:  Extubated, awake and stable to recovery.  INDICATION FOR PROCEDURE: 58 year old female complains of left posterolateral ankle pain due to a growing left ankle mass.  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  She presents now for excisional biopsy.  The risks and benefits of the alternative treatment options have been discussed in detail.  The patient wishes to proceed with surgery and specifically understands risks of bleeding, infection, nerve damage, blood clots, need for additional surgery, amputation and death.   PROCEDURE IN DETAIL: After preoperative consent was obtained and the correct operative site was identified, the patient was brought to the operating room supine on the stretcher.  General anesthesia was induced.  Preoperative antibiotics were administered.  Surgical timeout was taken.  The left lower extremity was exsanguinated and a thigh tourniquet inflated to 250 mmHg.  The patient was then turned in the prone position on the operating table with all bony prominences padded well.  The left lower extremity was prepped and draped in standard sterile fashion.  A longitudinal incision was made over the mass at the posterior lateral ankle.  Dissection was carried sharply down to the skin.  Superficial aspect of the mass was identified.  Blunt dissection was carried circumferentially around the mass freeing it from the surrounding soft tissues.  The mass was noted to have an intact  capsule circumferentially.  It was freed at its deep layer from the lateral aspect of the Achilles and passed off the field as a specimen to pathology.  The wound was irrigated copiously.  The tourniquet was released.  Hemostasis was achieved with electrocautery.  Vancomycin  powder was sprinkled into the wound.  The subcutaneous tissues were approximated with inverted simple sutures of 3-0 Monocryl.  The skin was closed with horizontal mattress sutures of 3-0 nylon.  Sterile dressings were applied followed by a compression wrap and a cam boot.  The patient was awakened from anesthesia and transported to the recovery room in stable condition.   FOLLOW UP PLAN: Weightbearing as tolerated on the lower extremity in the cam boot.  Follow-up in the office in 2 weeks for suture removal and path results.  No increased laxity exam with patient.    Justin Ollis PA-C was present and scrubbed for the duration of the operative case. His assistance was essential in positioning the patient, prepping and draping, gaining and maintaining exposure, performing the operation, closing and dressing the wounds and applying the splint.

## 2024-02-02 NOTE — Transfer of Care (Signed)
 Immediate Anesthesia Transfer of Care Note  Patient: NARISSA BEAUFORT  Procedure(s) Performed: EXCISION LEFT HINDFOOT MASS (Left: Foot)  Patient Location: PACU  Anesthesia Type:GA combined with regional for post-op pain  Level of Consciousness: drowsy and patient cooperative  Airway & Oxygen Therapy: Patient Spontanous Breathing and Patient connected to face mask oxygen  Post-op Assessment: Report given to RN and Post -op Vital signs reviewed and stable  Post vital signs: Reviewed and stable  Last Vitals:  Vitals Value Taken Time  BP 142/83 02/02/24 08:33  Temp 36.2 C 02/02/24 08:33  Pulse 85 02/02/24 08:33  Resp 17 02/02/24 08:33  SpO2 100 % 02/02/24 08:33    Last Pain:  Vitals:   02/02/24 0631  TempSrc: Temporal  PainSc: 0-No pain      Patients Stated Pain Goal: 2 (02/02/24 0631)  Complications: No notable events documented.

## 2024-02-02 NOTE — Anesthesia Postprocedure Evaluation (Signed)
 Anesthesia Post Note  Patient: Terri Luna  Procedure(s) Performed: EXCISION LEFT HINDFOOT MASS (Left: Foot)     Patient location during evaluation: PACU Anesthesia Type: Regional and General Level of consciousness: awake and alert, oriented and patient cooperative Pain management: pain level controlled Vital Signs Assessment: post-procedure vital signs reviewed and stable Respiratory status: spontaneous breathing, nonlabored ventilation and respiratory function stable Cardiovascular status: blood pressure returned to baseline and stable Postop Assessment: no apparent nausea or vomiting Anesthetic complications: no   No notable events documented.  Last Vitals:  Vitals:   02/02/24 0900 02/02/24 0919  BP: 132/81 129/74  Pulse: 79 87  Resp: 15 20  Temp:  (!) 36.1 C  SpO2: 97% 97%    Last Pain:  Vitals:   02/02/24 0919  TempSrc: Temporal  PainSc:    Pain Goal: Patients Stated Pain Goal: 2 (02/02/24 0631)  LLE Motor Response: No movement due to regional block (02/02/24 0845)              [name]

## 2024-02-02 NOTE — Anesthesia Preprocedure Evaluation (Addendum)
 Anesthesia Evaluation  Patient identified by MRN, date of birth, ID band Patient awake    Reviewed: Allergy & Precautions, NPO status , Patient's Chart, lab work & pertinent test results  Airway Mallampati: III  TM Distance: >3 FB Neck ROM: Full    Dental  (+) Teeth Intact, Dental Advisory Given   Pulmonary  Snores at night, no sleep study    Pulmonary exam normal breath sounds clear to auscultation       Cardiovascular negative cardio ROS Normal cardiovascular exam Rhythm:Regular Rate:Normal     Neuro/Psych negative neurological ROS  negative psych ROS   GI/Hepatic Neg liver ROS,,,S/p gastric sleeve   Endo/Other  Obesity BMI 37  Renal/GU negative Renal ROS  negative genitourinary   Musculoskeletal Lipoma LLE   Abdominal  (+) + obese  Peds negative pediatric ROS (+)  Hematology Hx lymphoma 2013   Anesthesia Other Findings   Reproductive/Obstetrics negative OB ROS                              Anesthesia Physical Anesthesia Plan  ASA: 2  Anesthesia Plan: General and Regional   Post-op Pain Management: Regional block* and Tylenol  PO (pre-op)*   Induction: Intravenous  PONV Risk Score and Plan: 3 and Ondansetron , Dexamethasone , Midazolam  and Treatment may vary due to age or medical condition  Airway Management Planned: LMA  Additional Equipment: None  Intra-op Plan:   Post-operative Plan: Extubation in OR  Informed Consent: I have reviewed the patients History and Physical, chart, labs and discussed the procedure including the risks, benefits and alternatives for the proposed anesthesia with the patient or authorized representative who has indicated his/her understanding and acceptance.     Dental advisory given  Plan Discussed with: CRNA  Anesthesia Plan Comments:          Anesthesia Quick Evaluation

## 2024-02-02 NOTE — Anesthesia Procedure Notes (Signed)
 Anesthesia Regional Block: Popliteal block   Pre-Anesthetic Checklist: , timeout performed,  Correct Patient, Correct Site, Correct Laterality,  Correct Procedure, Correct Position, site marked,  Risks and benefits discussed,  Surgical consent,  Pre-op evaluation,  At surgeon's request and post-op pain management  Laterality: Left  Prep: Maximum Sterile Barrier Precautions used, chloraprep       Needles:  Injection technique: Single-shot  Needle Type: Echogenic Stimulator Needle     Needle Length: 9cm  Needle Gauge: 22     Additional Needles:   Procedures:,,,, ultrasound used (permanent image in chart),,    Narrative:  Start time: 02/02/2024 6:50 AM End time: 02/02/2024 6:55 AM Injection made incrementally with aspirations every 5 mL.  Performed by: Personally  Anesthesiologist: Merla Almarie HERO, DO  Additional Notes: Monitors applied. No increased pain on injection. No increased resistance to injection. Injection made in 5cc increments. Good needle visualization. Patient tolerated procedure well.

## 2024-02-02 NOTE — Progress Notes (Signed)
Assisted Dr. Finucane with left, adductor canal, popliteal, ultrasound guided block. Side rails up, monitors on throughout procedure. See vital signs in flow sheet. Tolerated Procedure well. 

## 2024-02-03 ENCOUNTER — Encounter (HOSPITAL_BASED_OUTPATIENT_CLINIC_OR_DEPARTMENT_OTHER): Payer: Self-pay | Admitting: Orthopedic Surgery

## 2024-02-03 LAB — SURGICAL PATHOLOGY

## 2024-02-27 ENCOUNTER — Encounter: Payer: Self-pay | Admitting: Dermatology

## 2024-02-27 ENCOUNTER — Ambulatory Visit: Admitting: Dermatology

## 2024-02-27 VITALS — BP 128/76

## 2024-02-27 DIAGNOSIS — R7303 Prediabetes: Secondary | ICD-10-CM | POA: Insufficient documentation

## 2024-02-27 DIAGNOSIS — L811 Chloasma: Secondary | ICD-10-CM | POA: Diagnosis not present

## 2024-02-27 DIAGNOSIS — M25561 Pain in right knee: Secondary | ICD-10-CM | POA: Insufficient documentation

## 2024-02-27 NOTE — Progress Notes (Signed)
   Follow-Up Visit   Subjective  Terri Luna is a 58 y.o. female established patient who presents for FOLLOW UP on the diagnoses listed below:  Patient was last evaluated on 10/17/23.    Melasma: Prescribed Medrock Hydroquinone 12%. Recommended Eucerin Radiant Tone serum & SPF. Patient reports sxs are unchanged despite using Hydroquinone nightly and ERT daily - she stated she would like a more aggressive regimen.     The following portions of the chart were reviewed this encounter and updated as appropriate: medications, allergies, medical history  Review of Systems:  No other skin or systemic complaints except as noted in HPI or Assessment and Plan.  Objective  Well appearing patient in no apparent distress; mood and affect are within normal limits.   A focused examination was performed of the following areas: face   Relevant exam findings are noted in the Assessment and Plan.          Assessment & Plan   MELASMA Exam: reticulated hyperpigmented patches at face  Not at goal  - Assessment: Patient's melasma has improved since the last visit but is not completely resolved. Treatment history includes hydroquinone for 3-4 months, followed by tranexamic acid, then back to hydroquinone. The condition improved but never fully cleared. Sun exposure and daily exposure to laser light at work remains a significant factor in melasma persistence, especially during summer. Patient reports using Neutrogena Mineral Shield 70 SPF daily, but reapplication has been inconsistent.  - Plan:    Discontinue hydroquinone to prevent ochronosis    Start Radiant Tone (containing thiamidol) twice daily    Add melasmic product containing tranexamic acid, resveratrol, and vitamin C, to be mixed with Radiant Tone and applied morning and night    Continue Neutrogena Mineral Shield 70 SPF daily    Switch to mineral-tinted sunscreen for work to protect against light exposure    Reapply sunscreen  when outdoors   Return in 4 months (on 06/28/2024) for MELASMA.   Documentation: I have reviewed the above documentation for accuracy and completeness, and I agree with the above.  I, Shirron Maranda, CMA, am acting as scribe for [employer], DO.   Delon Lenis, DO

## 2024-02-27 NOTE — Patient Instructions (Addendum)
 Date: Mon Feb 27 2024  Hello Terri Luna,  Thank you for visiting today. Here is a summary of the key instructions:  - Medications:   - Use Eucerin Radiant Tone on dark spots morning and night   - Apply melasmic with transaminic acid (MedRock Compound), resveratrol, and vitamin C mixed with Radiant Tone morning and night   - Stop using hydroquinone for now  - Skin Care:   - Apply Neutrogena Mineral Shield 70 SPF sunscreen every morning   - Reapply sunscreen if going outside during the day   - Switch to a mineral-tinted sunscreen at work  - Follow-up:   - Return for follow-up appointment in 4 months  - Other Instructions:   - Be diligent about applying and reapplying sunscreen   - Avoid sun exposure when possible  Please reach out if you have any questions or concerns.  Warm regards,  Dr. Delon Lenis Dermatology Important Information  Due to recent changes in healthcare laws, you may see results of your pathology and/or laboratory studies on MyChart before the doctors have had a chance to review them. We understand that in some cases there may be results that are confusing or concerning to you. Please understand that not all results are received at the same time and often the doctors may need to interpret multiple results in order to provide you with the best plan of care or course of treatment. Therefore, we ask that you please give us  2 business days to thoroughly review all your results before contacting the office for clarification. Should we see a critical lab result, you will be contacted sooner.   If You Need Anything After Your Visit  If you have any questions or concerns for your doctor, please call our main line at [phone] If no one answers, please leave a voicemail as directed and we will return your call as soon as possible. Messages left after 4 pm will be answered the following business day.   You may also send us  a message via MyChart. We typically respond to  MyChart messages within 1-2 business days.  For prescription refills, please ask your pharmacy to contact our office. Our fax number is [phone].  If you have an urgent issue when the clinic is closed that cannot wait until the next business day, you can page your doctor at the number below.    Please note that while we do our best to be available for urgent issues outside of office hours, we are not available 24/7.   If you have an urgent issue and are unable to reach us , you may choose to seek medical care at your doctor's office, retail clinic, urgent care center, or emergency room.  If you have a medical emergency, please immediately call 911 or go to the emergency department. In the event of inclement weather, please call our main line at [phone] for an update on the status of any delays or closures.  Dermatology Medication Tips: Please keep the boxes that topical medications come in in order to help keep track of the instructions about where and how to use these. Pharmacies typically print the medication instructions only on the boxes and not directly on the medication tubes.   If your medication is too expensive, please contact our office at [phone] or send us  a message through MyChart.   We are unable to tell what your co-pay for medications will be in advance as this is different depending on your insurance coverage. However, we  may be able to find a substitute medication at lower cost or fill out paperwork to get insurance to cover a needed medication.   If a prior authorization is required to get your medication covered by your insurance company, please allow us  1-2 business days to complete this process.  Drug prices often vary depending on where the prescription is filled and some pharmacies may offer cheaper prices.  The website www.goodrx.com contains coupons for medications through different pharmacies. The prices here do not account for what the cost may be with  help from insurance (it may be cheaper with your insurance), but the website can give you the price if you did not use any insurance.  - You can print the associated coupon and take it with your prescription to the pharmacy.  - You may also stop by our office during regular business hours and pick up a GoodRx coupon card.  - If you need your prescription sent electronically to a different pharmacy, notify our office through [facility] or by phone at [phone]

## 2024-06-25 ENCOUNTER — Ambulatory Visit: Admitting: Dermatology
